# Patient Record
Sex: Male | Born: 1945 | Race: White | Hispanic: No | Marital: Married | Smoking: Former smoker
Health system: Southern US, Community
[De-identification: ages and names within clinical notes are randomized; demographics above are authoritative.]

## PROBLEM LIST (undated history)

## (undated) DIAGNOSIS — I10 Essential (primary) hypertension: Secondary | ICD-10-CM

## (undated) DIAGNOSIS — E785 Hyperlipidemia, unspecified: Secondary | ICD-10-CM

## (undated) DIAGNOSIS — I350 Nonrheumatic aortic (valve) stenosis: Secondary | ICD-10-CM

## (undated) HISTORY — PX: TONSILLECTOMY: SUR1361

## (undated) HISTORY — PX: HERNIA REPAIR: SHX51

## (undated) HISTORY — DX: Nonrheumatic aortic (valve) stenosis: I35.0

## (undated) HISTORY — PX: APPENDECTOMY: SHX54

---

## 1998-01-28 ENCOUNTER — Emergency Department (HOSPITAL_COMMUNITY): Admission: EM | Admit: 1998-01-28 | Discharge: 1998-01-28 | Payer: Self-pay | Admitting: Emergency Medicine

## 1998-01-28 ENCOUNTER — Encounter: Payer: Self-pay | Admitting: Emergency Medicine

## 2007-02-15 ENCOUNTER — Encounter: Admission: RE | Admit: 2007-02-15 | Discharge: 2007-02-15 | Payer: Self-pay | Admitting: Family Medicine

## 2007-02-19 ENCOUNTER — Encounter: Admission: RE | Admit: 2007-02-19 | Discharge: 2007-02-19 | Payer: Self-pay | Admitting: Family Medicine

## 2009-08-13 ENCOUNTER — Ambulatory Visit (HOSPITAL_BASED_OUTPATIENT_CLINIC_OR_DEPARTMENT_OTHER): Admission: RE | Admit: 2009-08-13 | Discharge: 2009-08-13 | Payer: Self-pay | Admitting: General Surgery

## 2010-04-21 LAB — POCT I-STAT, CHEM 8
BUN: 24 mg/dL — ABNORMAL HIGH (ref 6–23)
Calcium, Ion: 1.1 mmol/L — ABNORMAL LOW (ref 1.12–1.32)
Chloride: 107 mEq/L (ref 96–112)
Creatinine, Ser: 1 mg/dL (ref 0.4–1.5)
Glucose, Bld: 106 mg/dL — ABNORMAL HIGH (ref 70–99)
HCT: 45 % (ref 39.0–52.0)
Hemoglobin: 15.3 g/dL (ref 13.0–17.0)
Potassium: 3.8 mEq/L (ref 3.5–5.1)
Sodium: 141 mEq/L (ref 135–145)
TCO2: 25 mmol/L (ref 0–100)

## 2010-04-21 LAB — CBC
HCT: 44.1 % (ref 39.0–52.0)
Hemoglobin: 15.1 g/dL (ref 13.0–17.0)
MCH: 32.5 pg (ref 26.0–34.0)
MCHC: 34.2 g/dL (ref 30.0–36.0)
MCV: 95.1 fL (ref 78.0–100.0)
Platelets: 159 10*3/uL (ref 150–400)
RBC: 4.63 MIL/uL (ref 4.22–5.81)
RDW: 13.3 % (ref 11.5–15.5)
WBC: 10.5 10*3/uL (ref 4.0–10.5)

## 2010-04-21 LAB — DIFFERENTIAL
Basophils Absolute: 0 10*3/uL (ref 0.0–0.1)
Basophils Relative: 1 % (ref 0–1)
Eosinophils Absolute: 0.2 10*3/uL (ref 0.0–0.7)
Eosinophils Relative: 2 % (ref 0–5)
Lymphocytes Relative: 16 % (ref 12–46)
Lymphs Abs: 1.7 10*3/uL (ref 0.7–4.0)
Monocytes Absolute: 0.5 10*3/uL (ref 0.1–1.0)
Monocytes Relative: 5 % (ref 3–12)
Neutro Abs: 8.1 10*3/uL — ABNORMAL HIGH (ref 1.7–7.7)
Neutrophils Relative %: 77 % (ref 43–77)

## 2011-03-11 DIAGNOSIS — Z23 Encounter for immunization: Secondary | ICD-10-CM | POA: Diagnosis not present

## 2011-04-30 DIAGNOSIS — H524 Presbyopia: Secondary | ICD-10-CM | POA: Diagnosis not present

## 2011-06-17 DIAGNOSIS — E782 Mixed hyperlipidemia: Secondary | ICD-10-CM | POA: Diagnosis not present

## 2011-06-17 DIAGNOSIS — I10 Essential (primary) hypertension: Secondary | ICD-10-CM | POA: Diagnosis not present

## 2011-11-20 DIAGNOSIS — Z23 Encounter for immunization: Secondary | ICD-10-CM | POA: Diagnosis not present

## 2011-12-30 DIAGNOSIS — E782 Mixed hyperlipidemia: Secondary | ICD-10-CM | POA: Diagnosis not present

## 2011-12-30 DIAGNOSIS — I10 Essential (primary) hypertension: Secondary | ICD-10-CM | POA: Diagnosis not present

## 2011-12-30 DIAGNOSIS — Z125 Encounter for screening for malignant neoplasm of prostate: Secondary | ICD-10-CM | POA: Diagnosis not present

## 2011-12-30 DIAGNOSIS — Z0000 Encounter for general adult medical examination without abnormal findings: Secondary | ICD-10-CM | POA: Diagnosis not present

## 2012-06-07 DIAGNOSIS — I10 Essential (primary) hypertension: Secondary | ICD-10-CM | POA: Diagnosis not present

## 2012-06-07 DIAGNOSIS — E782 Mixed hyperlipidemia: Secondary | ICD-10-CM | POA: Diagnosis not present

## 2012-11-24 DIAGNOSIS — Z23 Encounter for immunization: Secondary | ICD-10-CM | POA: Diagnosis not present

## 2013-01-25 DIAGNOSIS — E782 Mixed hyperlipidemia: Secondary | ICD-10-CM | POA: Diagnosis not present

## 2013-01-25 DIAGNOSIS — Z0000 Encounter for general adult medical examination without abnormal findings: Secondary | ICD-10-CM | POA: Diagnosis not present

## 2013-01-25 DIAGNOSIS — Z125 Encounter for screening for malignant neoplasm of prostate: Secondary | ICD-10-CM | POA: Diagnosis not present

## 2013-01-25 DIAGNOSIS — I10 Essential (primary) hypertension: Secondary | ICD-10-CM | POA: Diagnosis not present

## 2013-01-25 DIAGNOSIS — Z1331 Encounter for screening for depression: Secondary | ICD-10-CM | POA: Diagnosis not present

## 2013-11-03 DIAGNOSIS — H25033 Anterior subcapsular polar age-related cataract, bilateral: Secondary | ICD-10-CM | POA: Diagnosis not present

## 2013-11-22 DIAGNOSIS — Z23 Encounter for immunization: Secondary | ICD-10-CM | POA: Diagnosis not present

## 2014-02-01 DIAGNOSIS — I10 Essential (primary) hypertension: Secondary | ICD-10-CM | POA: Diagnosis not present

## 2014-02-01 DIAGNOSIS — Z23 Encounter for immunization: Secondary | ICD-10-CM | POA: Diagnosis not present

## 2014-02-01 DIAGNOSIS — Z1389 Encounter for screening for other disorder: Secondary | ICD-10-CM | POA: Diagnosis not present

## 2014-02-01 DIAGNOSIS — Z125 Encounter for screening for malignant neoplasm of prostate: Secondary | ICD-10-CM | POA: Diagnosis not present

## 2014-02-01 DIAGNOSIS — Z87891 Personal history of nicotine dependence: Secondary | ICD-10-CM | POA: Diagnosis not present

## 2014-02-01 DIAGNOSIS — G250 Essential tremor: Secondary | ICD-10-CM | POA: Diagnosis not present

## 2014-02-01 DIAGNOSIS — Z0001 Encounter for general adult medical examination with abnormal findings: Secondary | ICD-10-CM | POA: Diagnosis not present

## 2014-02-01 DIAGNOSIS — E782 Mixed hyperlipidemia: Secondary | ICD-10-CM | POA: Diagnosis not present

## 2014-02-01 DIAGNOSIS — Z86010 Personal history of colonic polyps: Secondary | ICD-10-CM | POA: Diagnosis not present

## 2014-11-14 DIAGNOSIS — Z23 Encounter for immunization: Secondary | ICD-10-CM | POA: Diagnosis not present

## 2015-02-09 DIAGNOSIS — H25013 Cortical age-related cataract, bilateral: Secondary | ICD-10-CM | POA: Diagnosis not present

## 2015-02-27 DIAGNOSIS — I10 Essential (primary) hypertension: Secondary | ICD-10-CM | POA: Diagnosis not present

## 2015-02-27 DIAGNOSIS — Z125 Encounter for screening for malignant neoplasm of prostate: Secondary | ICD-10-CM | POA: Diagnosis not present

## 2015-03-05 DIAGNOSIS — Z0001 Encounter for general adult medical examination with abnormal findings: Secondary | ICD-10-CM | POA: Diagnosis not present

## 2015-03-05 DIAGNOSIS — Z86010 Personal history of colonic polyps: Secondary | ICD-10-CM | POA: Diagnosis not present

## 2015-03-05 DIAGNOSIS — R17 Unspecified jaundice: Secondary | ICD-10-CM | POA: Diagnosis not present

## 2015-03-05 DIAGNOSIS — Z87891 Personal history of nicotine dependence: Secondary | ICD-10-CM | POA: Diagnosis not present

## 2015-03-05 DIAGNOSIS — I10 Essential (primary) hypertension: Secondary | ICD-10-CM | POA: Diagnosis not present

## 2015-03-05 DIAGNOSIS — Z125 Encounter for screening for malignant neoplasm of prostate: Secondary | ICD-10-CM | POA: Diagnosis not present

## 2015-03-05 DIAGNOSIS — G250 Essential tremor: Secondary | ICD-10-CM | POA: Diagnosis not present

## 2015-03-05 DIAGNOSIS — E782 Mixed hyperlipidemia: Secondary | ICD-10-CM | POA: Diagnosis not present

## 2015-03-06 ENCOUNTER — Other Ambulatory Visit: Payer: Self-pay | Admitting: Family Medicine

## 2015-03-06 DIAGNOSIS — R17 Unspecified jaundice: Secondary | ICD-10-CM

## 2015-03-09 ENCOUNTER — Other Ambulatory Visit: Payer: Self-pay

## 2015-03-12 DIAGNOSIS — R749 Abnormal serum enzyme level, unspecified: Secondary | ICD-10-CM | POA: Diagnosis not present

## 2015-03-12 DIAGNOSIS — R945 Abnormal results of liver function studies: Secondary | ICD-10-CM | POA: Diagnosis not present

## 2015-03-14 ENCOUNTER — Ambulatory Visit
Admission: RE | Admit: 2015-03-14 | Discharge: 2015-03-14 | Disposition: A | Discharge status: Home / Self Care | Payer: Medicare Other | Source: Ambulatory Visit | Attending: Family Medicine | Admitting: Family Medicine

## 2015-03-14 DIAGNOSIS — R17 Unspecified jaundice: Secondary | ICD-10-CM

## 2015-03-14 DIAGNOSIS — K8020 Calculus of gallbladder without cholecystitis without obstruction: Secondary | ICD-10-CM | POA: Diagnosis not present

## 2015-04-03 DIAGNOSIS — R932 Abnormal findings on diagnostic imaging of liver and biliary tract: Secondary | ICD-10-CM | POA: Diagnosis not present

## 2015-04-06 ENCOUNTER — Other Ambulatory Visit: Payer: Self-pay | Admitting: Gastroenterology

## 2015-04-17 ENCOUNTER — Ambulatory Visit
Admission: RE | Admit: 2015-04-17 | Discharge: 2015-04-17 | Disposition: A | Discharge status: Home / Self Care | Payer: Medicare Other | Source: Ambulatory Visit | Attending: Gastroenterology | Admitting: Gastroenterology

## 2015-04-17 DIAGNOSIS — R935 Abnormal findings on diagnostic imaging of other abdominal regions, including retroperitoneum: Secondary | ICD-10-CM | POA: Diagnosis not present

## 2015-04-17 MED ORDER — GADOBENATE DIMEGLUMINE 529 MG/ML IV SOLN
17.0000 mL | Freq: Once | INTRAVENOUS | Status: AC | PRN
  Administered 2015-04-17: 17 mL via INTRAVENOUS

## 2015-09-10 DIAGNOSIS — E782 Mixed hyperlipidemia: Secondary | ICD-10-CM | POA: Diagnosis not present

## 2015-09-10 DIAGNOSIS — I10 Essential (primary) hypertension: Secondary | ICD-10-CM | POA: Diagnosis not present

## 2015-10-03 DIAGNOSIS — Z86010 Personal history of colonic polyps: Secondary | ICD-10-CM | POA: Diagnosis not present

## 2015-10-03 DIAGNOSIS — K5730 Diverticulosis of large intestine without perforation or abscess without bleeding: Secondary | ICD-10-CM | POA: Diagnosis not present

## 2015-10-29 DIAGNOSIS — M25511 Pain in right shoulder: Secondary | ICD-10-CM | POA: Diagnosis not present

## 2015-11-28 ENCOUNTER — Ambulatory Visit (INDEPENDENT_AMBULATORY_CARE_PROVIDER_SITE_OTHER): Payer: Self-pay | Admitting: Orthopedic Surgery

## 2015-11-28 DIAGNOSIS — Z23 Encounter for immunization: Secondary | ICD-10-CM | POA: Diagnosis not present

## 2015-12-06 ENCOUNTER — Ambulatory Visit (INDEPENDENT_AMBULATORY_CARE_PROVIDER_SITE_OTHER): Payer: Medicare Other | Admitting: Orthopedic Surgery

## 2015-12-06 ENCOUNTER — Encounter (INDEPENDENT_AMBULATORY_CARE_PROVIDER_SITE_OTHER): Payer: Self-pay | Admitting: Orthopedic Surgery

## 2015-12-06 VITALS — BP 130/71 | HR 55 | Ht 70.0 in | Wt 185.0 lb

## 2015-12-06 DIAGNOSIS — G8929 Other chronic pain: Secondary | ICD-10-CM

## 2015-12-06 DIAGNOSIS — M25511 Pain in right shoulder: Secondary | ICD-10-CM | POA: Diagnosis not present

## 2015-12-06 NOTE — Progress Notes (Signed)
   Office Visit Note   Patient: Matthew Simpson           Date of Birth: [birth year]           MRN: [id number] Visit Date: 12/06/2015 Requested by: No referring provider defined for this encounter. PCP:  Mayra Neer, MD  Subjective: Chief Complaint  Patient presents with  . Right Shoulder - Pain, Follow-up  Abhik is a 70 year old patient 3 months status post lawnmower cord pulling injury to his right shoulder.  Condition diclofenac taper.  Looked at with ultrasound last visit and he might have had a partial-thickness cuff tear but it was hard to assess with certainty.  States his range of motion is okay.  He was doing some hiking and he developed a sharp pain.  He can't do a pushup still.  Patient returns for recheck of right shoulder pain.  Injury was approximately 3 months ago, he pulled on the lawn mower cord too hard.  He is still taking the diclofenac.  He is having pain still, but less. He has a Art gallery manager, and she has helped him with some exercises.  He is trying to be conscious about what motions he makes with the right arm.                  Review of Systems all systems reviewed negative as they relate to the chief complaint the fevers or chills   Assessment & Plan: Visit Diagnoses: No diagnosis found.  Plan: Impression is persistent right shoulder pain with the mechanism very classic for rotator cuff tear.  Exam knotless convincing but he still is symptomatic.  In 3 months since injury and is taken 8 inflammatories and tried yoga course instructor exercises with the some but not complete relief.  Plan MRI arthrogram right shoulder to evaluate for cuff tear versus labral pathology versus biceps tendon pathology I don't think it can be nonoperative problem but knowing more about the structure of his cuff could help [place] to guide him in terms of activity restrictions.  Follow-Up Instructions: No Follow-up on file.   Orders:  No orders of the defined types were placed in  this encounter.  No orders of the defined types were placed in this encounter.     Procedures: No procedures performed   Clinical Data: No additional findings.  Objective: Vital Signs: There were no vitals taken for this visit.  Physical Exam  Ortho Exam  Specialty Comments:  No specialty comments available.  Imaging: No results found.   PMFS History: There are no active problems to display for this patient.  No past medical history on file.  No family history on file.  No past surgical history on file. Social History   Occupational History  . Not on file.   Social History Main Topics  . Smoking status: Former Smoker    Types: Cigarettes    Quit date: 2006  . Smokeless tobacco: Never Used  . Alcohol use 0.6 oz/week    1 Cans of beer per week  . Drug use: No  . Sexual activity: Not on file

## 2015-12-20 ENCOUNTER — Ambulatory Visit (INDEPENDENT_AMBULATORY_CARE_PROVIDER_SITE_OTHER): Payer: Medicare Other | Admitting: Orthopedic Surgery

## 2015-12-31 ENCOUNTER — Ambulatory Visit
Admission: RE | Admit: 2015-12-31 | Discharge: 2015-12-31 | Disposition: A | Discharge status: Home / Self Care | Payer: Medicare Other | Source: Ambulatory Visit | Attending: Orthopedic Surgery | Admitting: Orthopedic Surgery

## 2015-12-31 DIAGNOSIS — G8929 Other chronic pain: Secondary | ICD-10-CM

## 2015-12-31 DIAGNOSIS — M25511 Pain in right shoulder: Principal | ICD-10-CM

## 2015-12-31 DIAGNOSIS — S46011A Strain of muscle(s) and tendon(s) of the rotator cuff of right shoulder, initial encounter: Secondary | ICD-10-CM | POA: Diagnosis not present

## 2015-12-31 MED ORDER — IOPAMIDOL (ISOVUE-M 200) INJECTION 41%
15.0000 mL | Freq: Once | INTRAMUSCULAR | Status: AC
  Administered 2015-12-31: 15 mL via INTRA_ARTICULAR

## 2016-01-03 ENCOUNTER — Ambulatory Visit (INDEPENDENT_AMBULATORY_CARE_PROVIDER_SITE_OTHER): Payer: Medicare Other | Admitting: Orthopedic Surgery

## 2016-01-03 ENCOUNTER — Encounter (INDEPENDENT_AMBULATORY_CARE_PROVIDER_SITE_OTHER): Payer: Self-pay | Admitting: Orthopedic Surgery

## 2016-01-03 DIAGNOSIS — M75121 Complete rotator cuff tear or rupture of right shoulder, not specified as traumatic: Secondary | ICD-10-CM | POA: Diagnosis not present

## 2016-01-03 NOTE — Progress Notes (Signed)
Office Visit Note   Patient: Matthew Simpson           Date of Birth: May 23, 1945           MRN: [id number] Visit Date: 01/03/2016 Requested by: No referring provider defined for this encounter. PCP: Mayra Neer, MD  Subjective: Chief Complaint  Patient presents with  . Right Shoulder - Pain, Follow-up    HPI Matthew Simpson is a 70 year old patient with right shoulder pain.  Since I've seen and had an MRI scan.  Injured his shoulder 4 months ago.  MRI scan shows rotator cuff tear supraspinatus 16 x 19 mm with about a centimeter retraction.  He is not taking any pain medication for this.  He is very active doing workouts and yoga.              Review of Systems All systems reviewed are negative as they relate to the chief complaint within the history of present illness.  Patient denies  fevers or chills.    Assessment & Plan: Visit Diagnoses:  1. Complete tear of right rotator cuff     Plan: Impression is right shoulder rotator cuff tear in an active 70 year old patient.  He wants to ride this out a little bit before considering surgery.  Wife has to have revision cataract surgery tomorrow which involves the retina.  I did tell him the natural history of this was that it would not repair itself based on its size but it may not come significantly more symptomatic for him for a period of several years.  He is having a baseline amount of pain in this currently but has not had developed a frozen shoulder.  I think best option for him at this point would be clinical recheck in 6 months.  Anticipate that the tear should be fixable still at that time if his symptoms worsen.  Follow-Up Instructions: Return in about 6 months (around 07/02/2016).   Orders:  No orders of the defined types were placed in this encounter.  No orders of the defined types were placed in this encounter.     Procedures: No procedures performed   Clinical Data: No additional findings.  Objective: Vital  Signs: There were no vitals taken for this visit.  Physical Exam  Constitutional: He appears well-developed.  HENT:  Head: Normocephalic.  Eyes: EOM are normal.  Neck: Normal range of motion.  Cardiovascular: Normal rate.   Pulmonary/Chest: Effort normal.  Neurological: He is alert.  Skin: Skin is warm.  Psychiatric: He has a normal mood and affect.    Ortho Exam exam of the right shoulder demonstrates full active and passive range of motion.  However when he is going from arm at his waist to arm up he does have to maneuver and situated it does represent some rotator cuff weakness.  However I do not feel a lot of coarseness and grinding with internal/external rotation at varying degrees of abduction.  Rotator cuff strength in general to infraspinatus supraspinatus and subscap testing is intact and symmetric but slightly more painful on the right to supraspinatus testing  Specialty Comments:  No specialty comments available.  Imaging: No results found.   PMFS History: There are no active problems to display for this patient.  No past medical history on file.  No family history on file.  No past surgical history on file. Social History   Occupational History  . Not on file.   Social History Main Topics  . Smoking status: Former  Smoker    Types: Cigarettes    Quit date: 2006  . Smokeless tobacco: Never Used  . Alcohol use 0.6 oz/week    1 Cans of beer per week  . Drug use: No  . Sexual activity: Not on file

## 2016-01-19 ENCOUNTER — Encounter (HOSPITAL_COMMUNITY): Payer: Self-pay | Admitting: Emergency Medicine

## 2016-01-19 ENCOUNTER — Emergency Department (HOSPITAL_COMMUNITY)
Admission: EM | Admit: 2016-01-19 | Discharge: 2016-01-19 | Disposition: A | Discharge status: Home / Self Care | Payer: Medicare Other | Attending: Emergency Medicine | Admitting: Emergency Medicine

## 2016-01-19 DIAGNOSIS — W11XXXA Fall on and from ladder, initial encounter: Secondary | ICD-10-CM | POA: Diagnosis not present

## 2016-01-19 DIAGNOSIS — Y939 Activity, unspecified: Secondary | ICD-10-CM | POA: Diagnosis not present

## 2016-01-19 DIAGNOSIS — S81811A Laceration without foreign body, right lower leg, initial encounter: Secondary | ICD-10-CM | POA: Diagnosis not present

## 2016-01-19 DIAGNOSIS — I10 Essential (primary) hypertension: Secondary | ICD-10-CM | POA: Insufficient documentation

## 2016-01-19 DIAGNOSIS — S81819A Laceration without foreign body, unspecified lower leg, initial encounter: Secondary | ICD-10-CM

## 2016-01-19 DIAGNOSIS — Z87891 Personal history of nicotine dependence: Secondary | ICD-10-CM | POA: Diagnosis not present

## 2016-01-19 DIAGNOSIS — Z7982 Long term (current) use of aspirin: Secondary | ICD-10-CM | POA: Insufficient documentation

## 2016-01-19 DIAGNOSIS — Y929 Unspecified place or not applicable: Secondary | ICD-10-CM | POA: Diagnosis not present

## 2016-01-19 DIAGNOSIS — S81812A Laceration without foreign body, left lower leg, initial encounter: Secondary | ICD-10-CM | POA: Diagnosis not present

## 2016-01-19 DIAGNOSIS — Y999 Unspecified external cause status: Secondary | ICD-10-CM | POA: Insufficient documentation

## 2016-01-19 HISTORY — DX: Hyperlipidemia, unspecified: E78.5

## 2016-01-19 HISTORY — DX: Essential (primary) hypertension: I10

## 2016-01-19 MED ORDER — LIDOCAINE-EPINEPHRINE (PF) 2 %-1:200000 IJ SOLN
20.0000 mL | Freq: Once | INTRAMUSCULAR | Status: AC
  Administered 2016-01-19: 20 mL

## 2016-01-19 MED ORDER — LIDOCAINE-EPINEPHRINE (PF) 2 %-1:200000 IJ SOLN
INTRAMUSCULAR | Status: AC
  Filled 2016-01-19: qty 20

## 2016-01-19 MED ORDER — TETANUS-DIPHTH-ACELL PERTUSSIS 5-2.5-18.5 LF-MCG/0.5 IM SUSP
0.5000 mL | Freq: Once | INTRAMUSCULAR | Status: AC
  Administered 2016-01-19: 0.5 mL via INTRAMUSCULAR
  Filled 2016-01-19: qty 0.5

## 2016-01-19 NOTE — ED Triage Notes (Signed)
Pt presents with bilateral lacerations to lower legs related to event with ladder. No fall or LOC.

## 2016-01-19 NOTE — ED Provider Notes (Signed)
[facility] DEPT Provider Note   CSN: [id number] Arrival date & time: 01/19/16  [id number]     History   Chief Complaint Chief Complaint  Patient presents with  . Laceration    HPI Matthew Simpson is a 70 y.o. male. He presents lacerations to the anterior aspect of both lower legs. He was up on a ladder cleaning his first for color. The ladder slid backwards at the bottom. He fell between the rungs. The rung of the ladder and lacerated the anterior aspect of both lower legs. He is ambulatory. Minimal pain. Cough wraps on his legs and drove himself here.  HPI  Past Medical History:  Diagnosis Date  . Hyperlipemia   . Hypertension     There are no active problems to display for this patient.   Past Surgical History:  Procedure Laterality Date  . APPENDECTOMY    . HERNIA REPAIR    . TONSILLECTOMY         Home Medications    Prior to Admission medications   Medication Sig Start Date End Date Taking? Authorizing Provider  aspirin 81 MG tablet Take 81 mg by mouth daily.   Yes Historical Provider, MD  atorvastatin (LIPITOR) 40 MG tablet Take 20 mg by mouth at bedtime. 01/07/16  Yes Historical Provider, MD  lisinopril (PRINIVIL,ZESTRIL) 5 MG tablet Take 5 mg by mouth daily.    Yes Historical Provider, MD  lisinopril-hydrochlorothiazide (PRINZIDE,ZESTORETIC) 20-12.5 MG tablet Take 2 tablets by mouth every morning. 10/15/15  Yes Historical Provider, MD  Multiple Vitamins-Minerals (MULTIVITAMIN ADULT PO) Take 1 tablet by mouth daily.   Yes Historical Provider, MD    Family History No family history on file.  Social History Social History  Substance Use Topics  . Smoking status: Former Smoker    Types: Cigarettes    Quit date: 2006  . Smokeless tobacco: Never Used  . Alcohol use 0.6 oz/week    1 Cans of beer per week     Allergies   Patient has no known allergies.   Review of Systems Review of Systems  Constitutional: Negative for appetite change, chills,  diaphoresis, fatigue and fever.  HENT: Negative for mouth sores, sore throat and trouble swallowing.   Eyes: Negative for visual disturbance.  Respiratory: Negative for cough, chest tightness, shortness of breath and wheezing.   Cardiovascular: Negative for chest pain.  Gastrointestinal: Negative for abdominal distention, abdominal pain, diarrhea, nausea and vomiting.  Endocrine: Negative for polydipsia, polyphagia and polyuria.  Genitourinary: Negative for dysuria, frequency and hematuria.  Musculoskeletal: Negative for gait problem.  Skin: Positive for wound. Negative for color change, pallor and rash.  Neurological: Negative for dizziness, syncope, light-headedness and headaches.  Hematological: Does not bruise/bleed easily.  Psychiatric/Behavioral: Negative for behavioral problems and confusion.     Physical Exam Updated Vital Signs BP 160/70 (BP Location: Left Arm)   Pulse 62   Temp 97.5 F (36.4 C) (Oral)   Resp 18   Ht 5\' 10"  (1.778 m)   Wt 182 lb (82.6 kg)   SpO2 99%   BMI 26.11 kg/m   Physical Exam  Constitutional: He is oriented to person, place, and time. He appears well-developed and well-nourished. No distress.  HENT:  Head: Normocephalic.  Eyes: Conjunctivae are normal. Pupils are equal, round, and reactive to light. No scleral icterus.  Neck: Normal range of motion. Neck supple. No thyromegaly present.  Cardiovascular: Normal rate and regular rhythm.  Exam reveals no gallop and no friction rub.  No murmur heard. Pulmonary/Chest: Effort normal and breath sounds normal. No respiratory distress. He has no wheezes. He has no rales.  Abdominal: Soft. Bowel sounds are normal. He exhibits no distension. There is no tenderness. There is no rebound.  Musculoskeletal: Normal range of motion.  Neurological: He is alert and oriented to person, place, and time.  Skin: Skin is warm and dry. No rash noted.  [place]- lacerations central aspect of the midportion of both  lower legs anteriorly.  Psychiatric: He has a normal mood and affect. His behavior is normal.     ED Treatments / Results  Labs (all labs ordered are listed, but only abnormal results are displayed) Labs Reviewed - No data to display  EKG  EKG Interpretation None       Radiology No results found.  Procedures Procedures (including critical care time)  Medications Ordered in ED Medications  lidocaine-EPINEPHrine (XYLOCAINE W/EPI) 2 %-1:200000 (PF) injection (  Hold 01/19/16 0909)  lidocaine-EPINEPHrine (XYLOCAINE W/EPI) 2 %-1:200000 (PF) injection 20 mL (20 mLs Infiltration Given by Other 01/19/16 [id number])     Initial Impression / Assessment and Plan / ED Course  I have reviewed the triage vital signs and the nursing notes.  Pertinent labs & imaging results that were available during my care of the patient were reviewed by me and considered in my medical decision making (see chart for details).  Clinical Course    LACERATION REPAIR Performed by: Lolita Patella Authorized by: Lolita Patella Consent: Verbal consent obtained. Risks and benefits: risks, benefits and alternatives were discussed Consent given by: patient Patient identity confirmed: provided demographic data Prepped and Draped in normal sterile fashion Wound explored  Laceration Location: Right lower leg  Laceration Length: Head 10cm  No Foreign Bodies seen or palpated  Anesthesia: local infiltration  Local anesthetic: lidocaine 1% c epinephrine  Anesthetic total: 5 ml  Irrigation method: syringe Amount of cleaning: standard  Skin closure: 3-0 nylon   Number of sutures: 9  Technique: Simple and horizontal mattress  Patient tolerance: Patient tolerated the procedure well with no immediate complications.  LACERATION REPAIR Performed by: Lolita Patella Authorized by: Lolita Patella Consent: Verbal consent obtained. Risks and benefits: risks, benefits and alternatives were  discussed Consent given by: patient Patient identity confirmed: provided demographic data Prepped and Draped in normal sterile fashion Wound explored  Laceration Location: Left lower lef  Laceration Length: 10cm  No Foreign Bodies seen or palpated  Anesthesia: local infiltration  Local anesthetic: lidocaine 1 % c epinephrine  Anesthetic total: 5 ml  Irrigation method: syringe Amount of cleaning: standard  Skin closure: 3-0 nylon  Number of sutures: 9  Technique: simple and horizontal mattress  Patient tolerance: Patient tolerated the procedure well with no immediate complications.    Final Clinical Impressions(s) / ED Diagnoses   Final diagnoses:  Laceration of lower extremity, unspecified laterality, initial encounter    New Prescriptions New Prescriptions   No medications on file     Tanna Furry, MD 01/19/16 [phone]

## 2016-01-19 NOTE — ED Notes (Signed)
Dr. Jeneen Rinks sutured the wounds; and then per his instruction I applied petrolatum gauze/abd's and secured all with ACE wrap bilat. Pt. Thanks [place] for our care.

## 2016-01-19 NOTE — Discharge Instructions (Signed)
Suture removal in 10-14 days.  Ace wraps when up or active.

## 2016-02-05 DIAGNOSIS — S81811D Laceration without foreign body, right lower leg, subsequent encounter: Secondary | ICD-10-CM | POA: Diagnosis not present

## 2016-02-05 DIAGNOSIS — S81812D Laceration without foreign body, left lower leg, subsequent encounter: Secondary | ICD-10-CM | POA: Diagnosis not present

## 2016-02-05 DIAGNOSIS — W19XXXD Unspecified fall, subsequent encounter: Secondary | ICD-10-CM | POA: Diagnosis not present

## 2016-02-05 DIAGNOSIS — Z4802 Encounter for removal of sutures: Secondary | ICD-10-CM | POA: Diagnosis not present

## 2016-05-26 DIAGNOSIS — Z1159 Encounter for screening for other viral diseases: Secondary | ICD-10-CM | POA: Diagnosis not present

## 2016-05-26 DIAGNOSIS — E782 Mixed hyperlipidemia: Secondary | ICD-10-CM | POA: Diagnosis not present

## 2016-05-26 DIAGNOSIS — I10 Essential (primary) hypertension: Secondary | ICD-10-CM | POA: Diagnosis not present

## 2016-05-26 DIAGNOSIS — Z125 Encounter for screening for malignant neoplasm of prostate: Secondary | ICD-10-CM | POA: Diagnosis not present

## 2016-05-26 DIAGNOSIS — Z0000 Encounter for general adult medical examination without abnormal findings: Secondary | ICD-10-CM | POA: Diagnosis not present

## 2016-05-26 DIAGNOSIS — G250 Essential tremor: Secondary | ICD-10-CM | POA: Diagnosis not present

## 2016-07-02 ENCOUNTER — Encounter (INDEPENDENT_AMBULATORY_CARE_PROVIDER_SITE_OTHER): Payer: Self-pay | Admitting: Orthopedic Surgery

## 2016-07-02 ENCOUNTER — Ambulatory Visit (INDEPENDENT_AMBULATORY_CARE_PROVIDER_SITE_OTHER): Payer: Medicare Other | Admitting: Orthopedic Surgery

## 2016-07-02 DIAGNOSIS — M75121 Complete rotator cuff tear or rupture of right shoulder, not specified as traumatic: Secondary | ICD-10-CM | POA: Diagnosis not present

## 2016-07-03 NOTE — Progress Notes (Signed)
Office Visit Note   Patient: Matthew Simpson           Date of Birth: 1945/09/14           MRN: [id number] Visit Date: 07/02/2016 Requested by: Mayra Neer, MD [address] [place], [place] [address] PCP: Mayra Neer, MD  Subjective: Chief Complaint  Patient presents with  . Right Shoulder - Follow-up    HPI: Matthew Simpson  a 71 year old patient with right shoulder pain he has a known full-thickness supraspinatus tear 15 x 20 mm with 1 cm of retraction.  Patient states his right shoulder hurts and sometimes but he has decided to live with it.  He has to be a little bit more careful with it.  Not taking any medications.  He does yoga and gym machines.  He is now 9 months out from injury.  Has not taking any medication recently              ROS: All systems reviewed are negative as they relate to the chief complaint within the history of present illness.  Patient denies  fevers or chills.   Assessment & Plan: Visit Diagnoses:  1. Nontraumatic complete tear of rotator cuff, right     Plan: Impression is rotator cuff tear which is becoming less symptomatic.  Patient is adjusting his activities accordingly.  Decision point today was for or against surgery.  He has been able to modify his workouts to fit his pain within his envelope of function.  He is going to live with this for now.  If symptoms change he'll come back and we could consider fixation.  I'll see him back as needed.  Follow-Up Instructions: Return if symptoms worsen or fail to improve.   Orders:  No orders of the defined types were placed in this encounter.  No orders of the defined types were placed in this encounter.     Procedures: No procedures performed   Clinical Data: No additional findings.  Objective: Vital Signs: There were no vitals taken for this visit.  Physical Exam:   Constitutional: Patient appears well-developed HEENT:  Head: Normocephalic Eyes:EOM are normal Neck: Normal  range of motion Cardiovascular: Normal rate Pulmonary/chest: Effort normal Neurologic: Patient is alert Skin: Skin is warm Psychiatric: Patient has normal mood and affect    Ortho Exam: Orthopedic exam demonstrates laxity and passive range of motion the right shoulder with only the slightest amount of weakness to abduction testing on the right compared to the left.  External rotation strength testing pre-symmetric bilaterally with equivocal impingement signs on the right negative on the left.  No acromioclavicular joint tenderness.  Negative apprehension relocation testing.  No other masses lymph adenopathy or skin changes noted in the right shoulder region.  Specialty Comments:  No specialty comments available.  Imaging: No results found.   PMFS History: Patient Active Problem List   Diagnosis Date Noted  . Nontraumatic complete tear of rotator cuff, right 07/02/2016   Past Medical History:  Diagnosis Date  . Hyperlipemia   . Hypertension     No family history on file.  Past Surgical History:  Procedure Laterality Date  . APPENDECTOMY    . HERNIA REPAIR    . TONSILLECTOMY     Social History   Occupational History  . Not on file.   Social History Main Topics  . Smoking status: Former Smoker    Types: Cigarettes    Quit date: 2006  . Smokeless tobacco: Never  Used  . Alcohol use 0.6 oz/week    1 Cans of beer per week  . Drug use: No  . Sexual activity: Not on file

## 2016-10-27 DIAGNOSIS — Z23 Encounter for immunization: Secondary | ICD-10-CM | POA: Diagnosis not present

## 2017-06-04 DIAGNOSIS — E782 Mixed hyperlipidemia: Secondary | ICD-10-CM | POA: Diagnosis not present

## 2017-06-04 DIAGNOSIS — I10 Essential (primary) hypertension: Secondary | ICD-10-CM | POA: Diagnosis not present

## 2017-06-04 DIAGNOSIS — R011 Cardiac murmur, unspecified: Secondary | ICD-10-CM | POA: Diagnosis not present

## 2017-06-04 DIAGNOSIS — Z125 Encounter for screening for malignant neoplasm of prostate: Secondary | ICD-10-CM | POA: Diagnosis not present

## 2017-06-04 DIAGNOSIS — G250 Essential tremor: Secondary | ICD-10-CM | POA: Diagnosis not present

## 2017-06-04 DIAGNOSIS — Z0000 Encounter for general adult medical examination without abnormal findings: Secondary | ICD-10-CM | POA: Diagnosis not present

## 2017-06-04 DIAGNOSIS — Z23 Encounter for immunization: Secondary | ICD-10-CM | POA: Diagnosis not present

## 2017-10-21 DIAGNOSIS — Z23 Encounter for immunization: Secondary | ICD-10-CM | POA: Diagnosis not present

## 2017-12-16 IMAGING — US US ABDOMEN LIMITED
1 series · 13 of 25 positions shown · non-contrast
Comparison: None.

ADDENDUM:
These results were called to the ordering clinician or
representative by the Radiologist Assistant, and communication
documented in the PACS or zVision Dashboard.
CLINICAL DATA: 69-year-old male with abnormally elevated bilirubin.
Initial encounter.

EXAM:
US ABDOMEN LIMITED - RIGHT UPPER QUADRANT

[Series 1: us abdomen limited · 0.32mm/px · 13 of 52 slices shown]
[im 1/52]
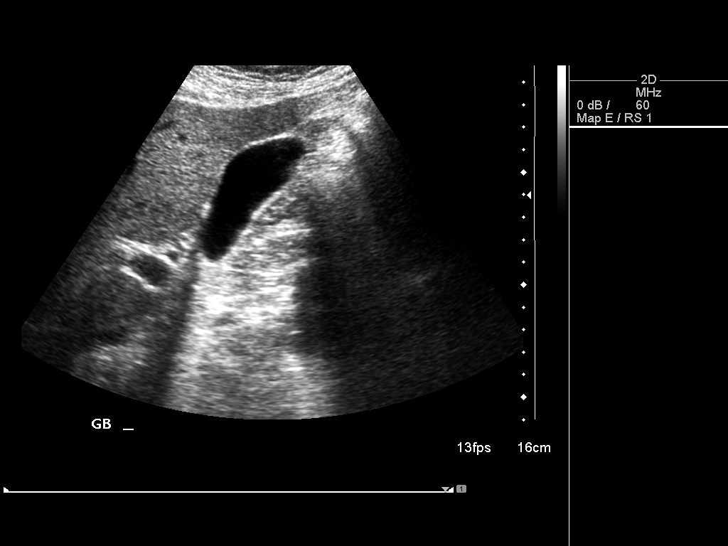
[im 5/52]
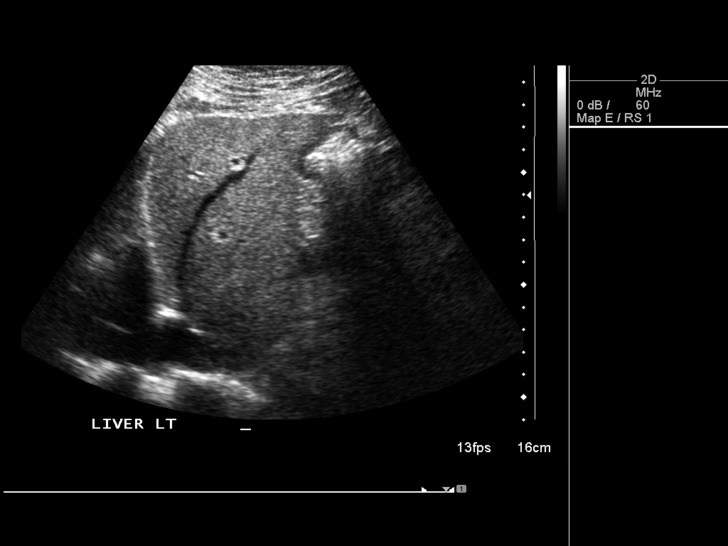
[im 9/52]
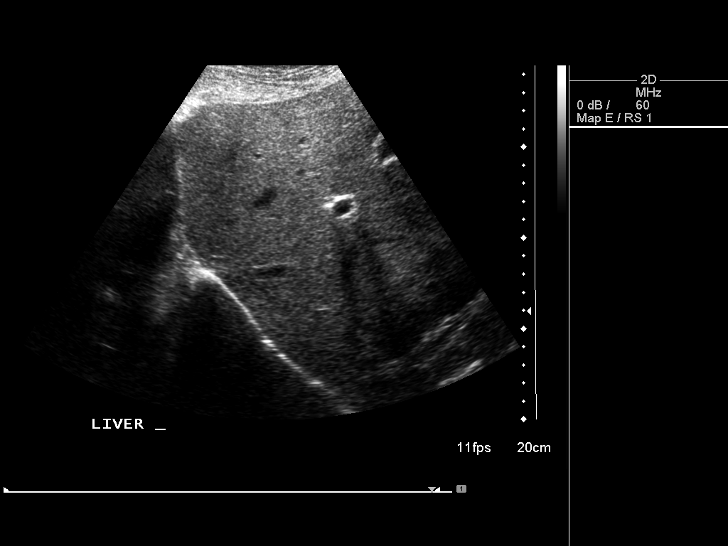
[im 13/52]
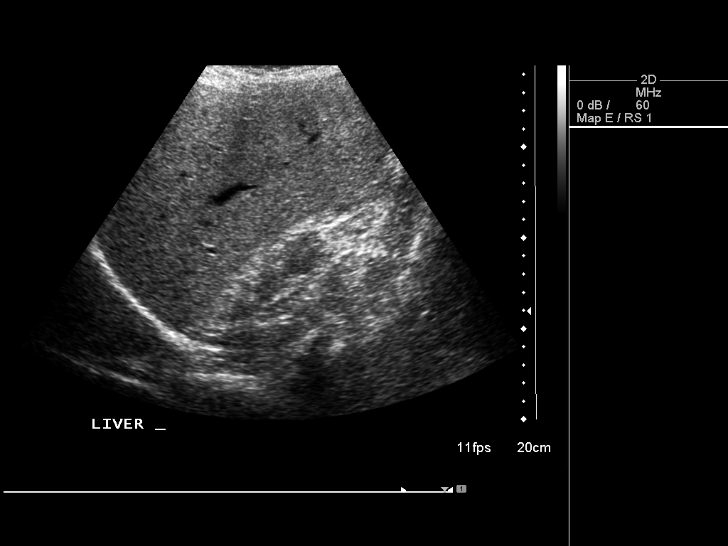
[im 18/52]
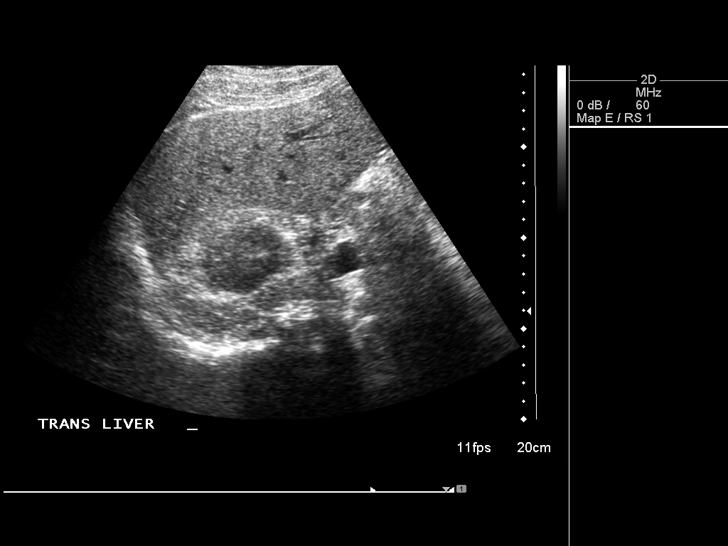
[im 22/52]
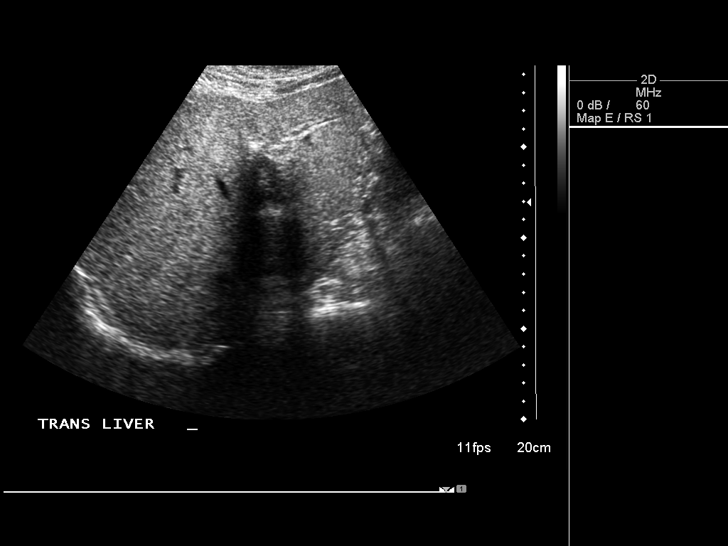
[im 26/52]
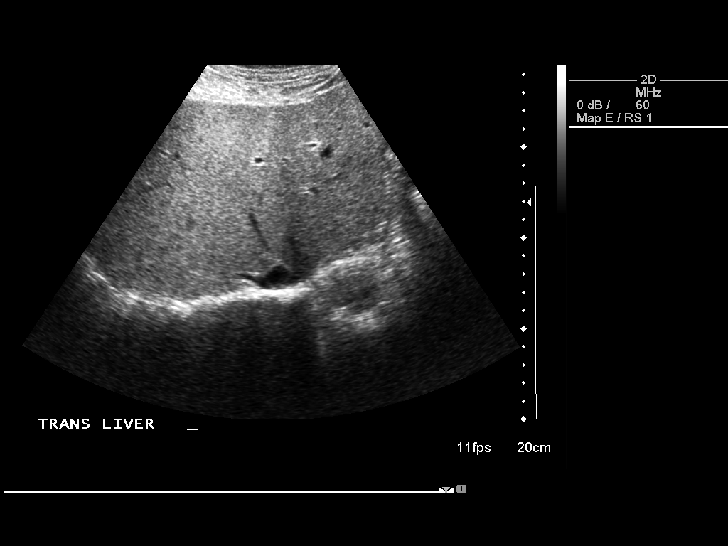
[im 30/52]
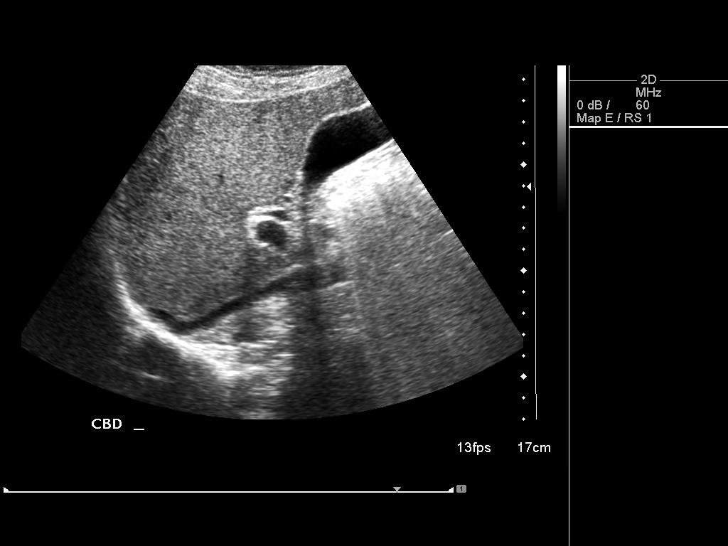
[im 35/52]
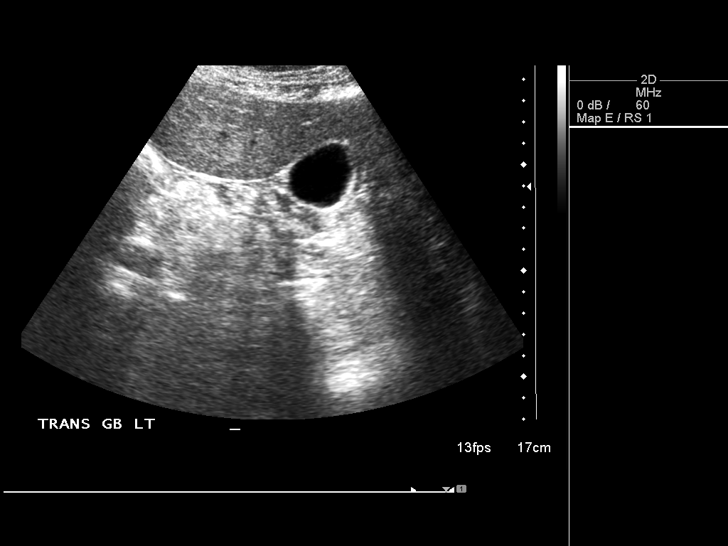
[im 39/52]
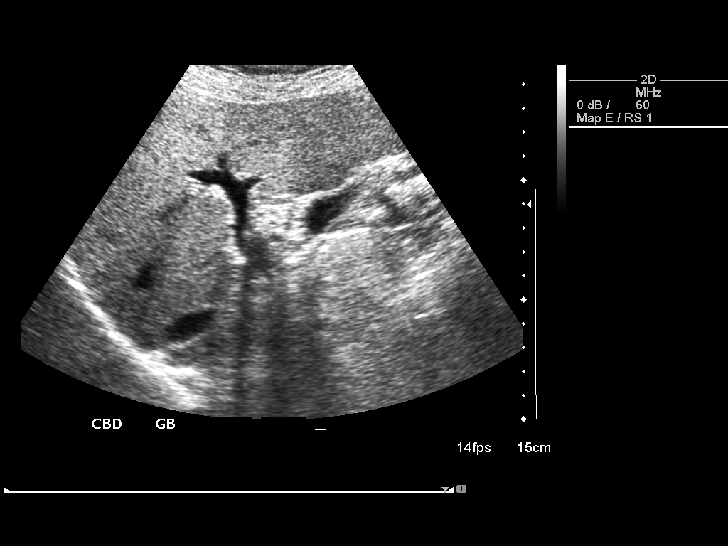
[im 43/52]
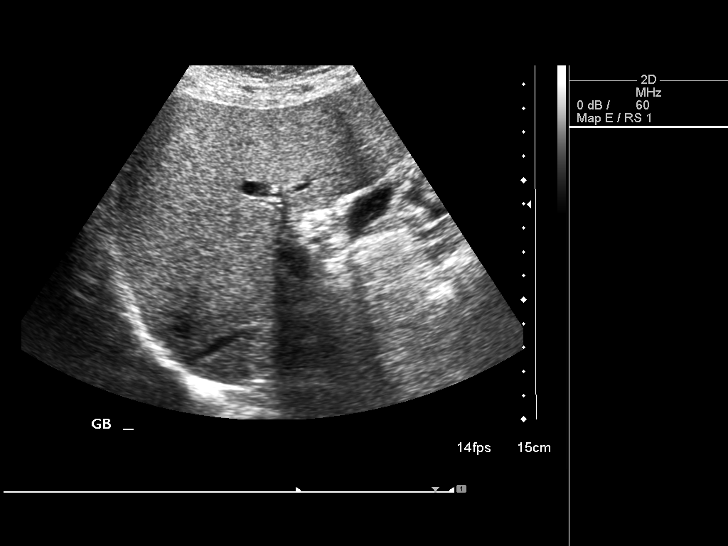
[im 47/52]
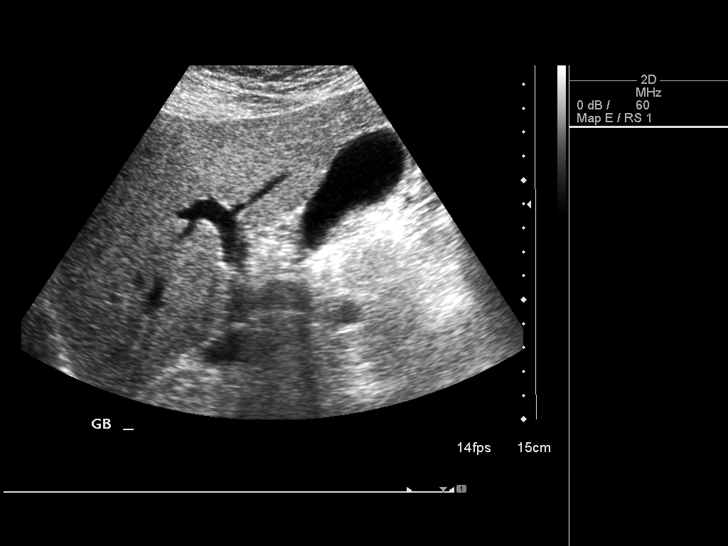
[im 52/52]
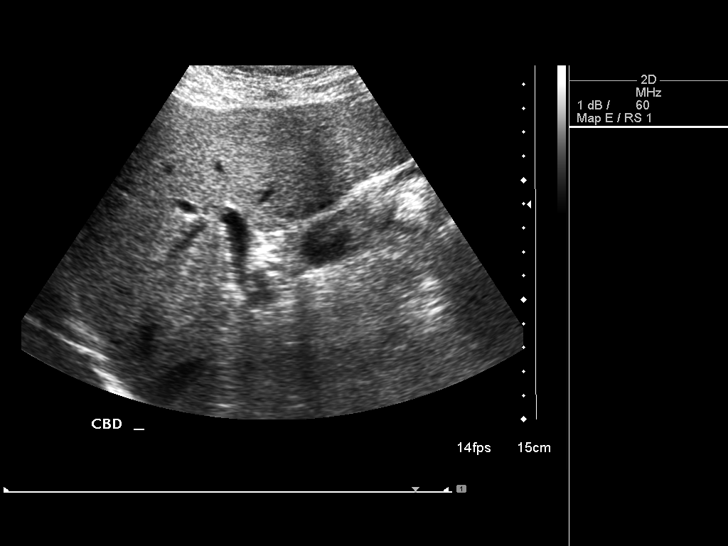

[13 of 25 positions shown; findings below may reference images not displayed]

FINDINGS: Gallbladder:

Normal gallbladder wall thickness of 1 mm. No sonographic Murphy
sign elicited and no pericholecystic fluid. However, there is
moderate suspicion of an 8 mm stone in the neck of the gallbladder
(image 50), near the confluence of the cystic duct.

Common bile duct:

Diameter: 6 mm, upper limits of normal. Moderate suspicion also of a
5 mm calculus here (image 30).

Liver:

Echogenicity at the upper limits of normal. No intrahepatic biliary
ductal dilatation. No discrete liver lesion.

Other findings: Negative visible right kidney with a Bosniak
category 1 lower pole cyst (image 16).
IMPRESSION: 1. Questionable 5-8 mm stones at both the gallbladder neck and CBD
which could explain elevated bilirubin. However, no changes of acute
cholecystitis or biliary ductal dilatation to confirm the
abnormality.
Overall, followup MRCP may best evaluate further.
2. Normal liver otherwise. Right renal simple cyst, inconsequential.

## 2018-07-07 DIAGNOSIS — Z7189 Other specified counseling: Secondary | ICD-10-CM | POA: Diagnosis not present

## 2018-07-07 DIAGNOSIS — G250 Essential tremor: Secondary | ICD-10-CM | POA: Diagnosis not present

## 2018-07-07 DIAGNOSIS — I10 Essential (primary) hypertension: Secondary | ICD-10-CM | POA: Diagnosis not present

## 2018-07-07 DIAGNOSIS — E782 Mixed hyperlipidemia: Secondary | ICD-10-CM | POA: Diagnosis not present

## 2018-07-07 DIAGNOSIS — R011 Cardiac murmur, unspecified: Secondary | ICD-10-CM | POA: Diagnosis not present

## 2018-07-07 DIAGNOSIS — Z125 Encounter for screening for malignant neoplasm of prostate: Secondary | ICD-10-CM | POA: Diagnosis not present

## 2018-07-07 DIAGNOSIS — Z0000 Encounter for general adult medical examination without abnormal findings: Secondary | ICD-10-CM | POA: Diagnosis not present

## 2018-07-12 DIAGNOSIS — Z125 Encounter for screening for malignant neoplasm of prostate: Secondary | ICD-10-CM | POA: Diagnosis not present

## 2018-07-12 DIAGNOSIS — I10 Essential (primary) hypertension: Secondary | ICD-10-CM | POA: Diagnosis not present

## 2018-07-12 DIAGNOSIS — E782 Mixed hyperlipidemia: Secondary | ICD-10-CM | POA: Diagnosis not present

## 2018-10-04 IMAGING — XA DG FLUORO GUIDE NDL PLC/BX
3 series · 3 of 3 positions shown · non-contrast
Comparison: none

CLINICAL DATA: RIGHT shoulder pain. Injured pulling a lawn mower
cord.

[Series 1: ortho standard · 1 of 1 slices shown (1 of 3)]
[im 1/1]
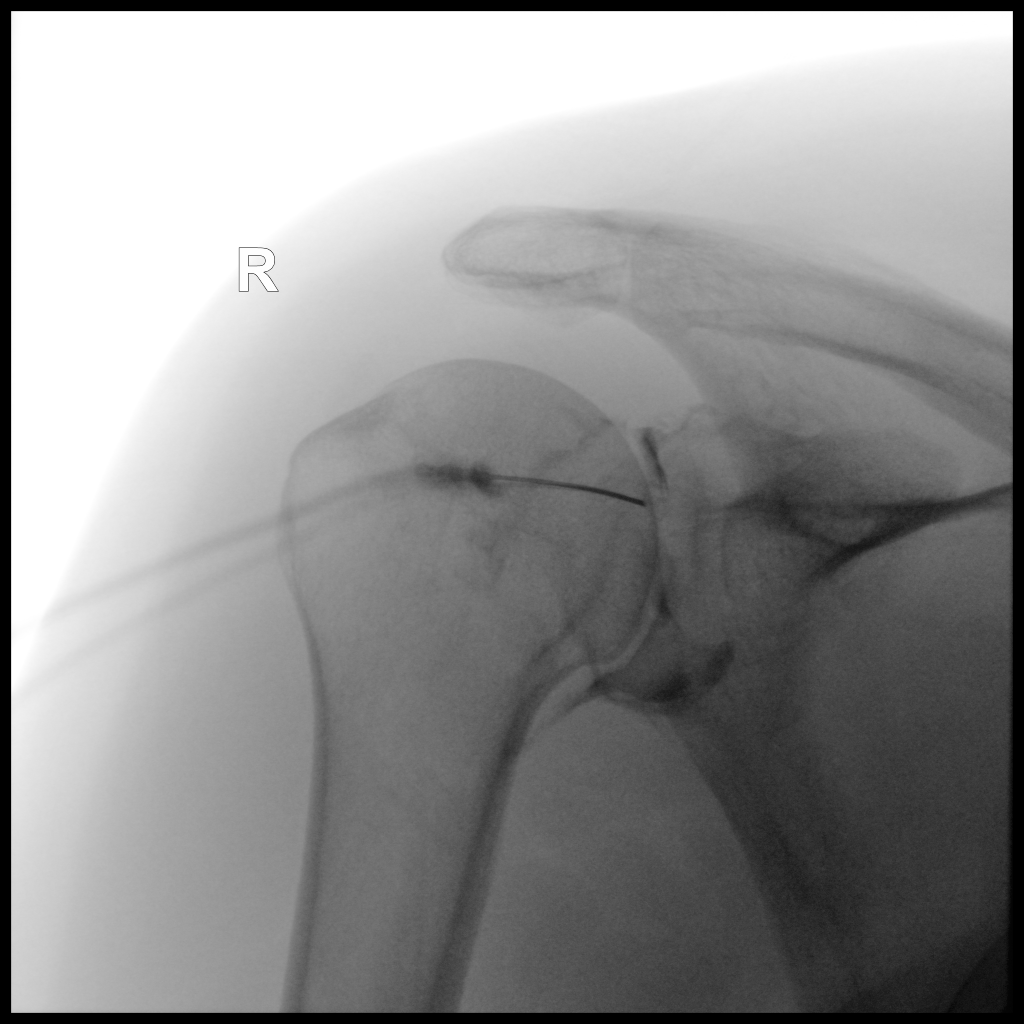

[Series 2: ortho standard · 1 of 1 slices shown (2 of 3)]
[im 1/1]
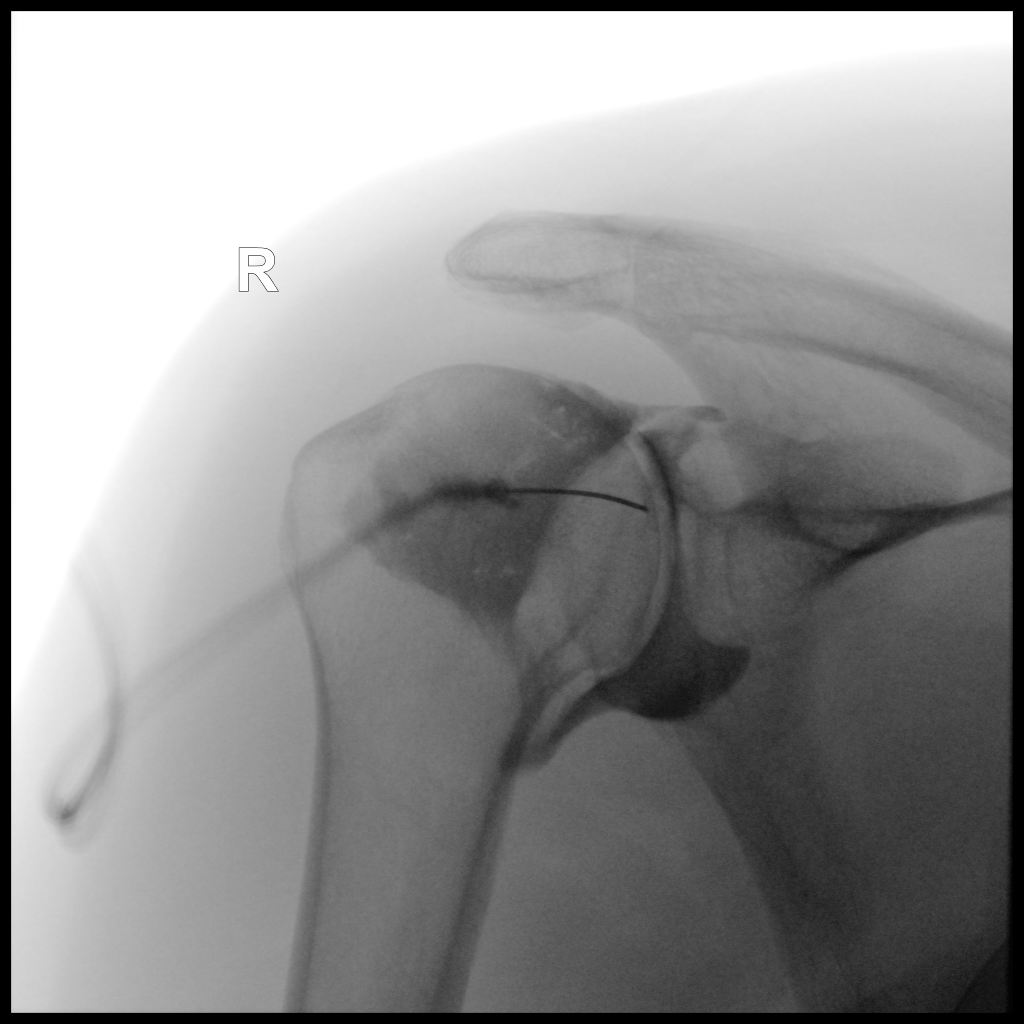

[Series 3: ortho standard · 1 of 1 slices shown (3 of 3)]
[im 1/1]
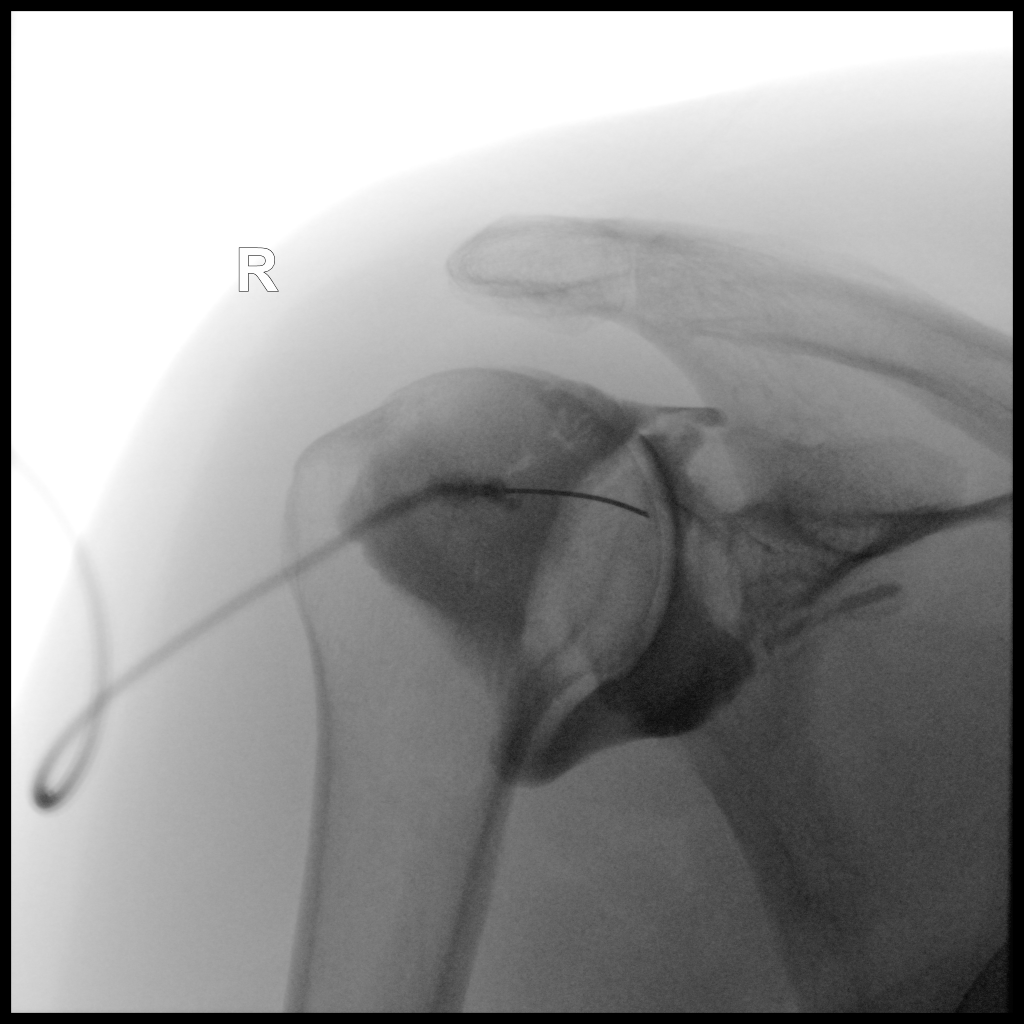

[3 of 3 positions shown; findings below may reference images not displayed]

FLUOROSCOPY TIME:  4 seconds corresponding to a Dose Area Product of
10.68?microGy*m2

PROCEDURE:
RIGHT SHOULDER INJECTION UNDER FLUOROSCOPY

Informed written consent was obtained.  Time-out was performed.

An appropriate skin entrance site was determined. The site was
marked, prepped with Betadine, draped in the usual sterile fashion,
and infiltrated locally with 1% Lidocaine. 22 gauge spinal needle
was advanced to the superomedial margin of the humeral head under
intermittent fluoroscopy. 1 ml of Lidocaine injected easily. A
mixture of 0.1 ml Multihance and 20 ml of dilute Isovue M 200 was
then used to opacify the RIGHT shoulder capsule. No immediate
complication.
IMPRESSION: Technically successful RIGHT shoulder injection for MRI.

## 2018-10-28 DIAGNOSIS — E782 Mixed hyperlipidemia: Secondary | ICD-10-CM | POA: Diagnosis not present

## 2018-11-03 ENCOUNTER — Other Ambulatory Visit: Payer: Self-pay | Admitting: Family Medicine

## 2018-11-03 DIAGNOSIS — R17 Unspecified jaundice: Secondary | ICD-10-CM

## 2018-11-06 DIAGNOSIS — Z23 Encounter for immunization: Secondary | ICD-10-CM | POA: Diagnosis not present

## 2018-11-10 ENCOUNTER — Ambulatory Visit
Admission: RE | Admit: 2018-11-10 | Discharge: 2018-11-10 | Disposition: A | Discharge status: Home / Self Care | Payer: Medicare Other | Source: Ambulatory Visit | Attending: Family Medicine | Admitting: Family Medicine

## 2018-11-10 DIAGNOSIS — R17 Unspecified jaundice: Secondary | ICD-10-CM

## 2018-11-10 DIAGNOSIS — K760 Fatty (change of) liver, not elsewhere classified: Secondary | ICD-10-CM | POA: Diagnosis not present

## 2019-02-22 ENCOUNTER — Ambulatory Visit: Payer: Medicare Other | Attending: Internal Medicine

## 2019-02-22 DIAGNOSIS — Z23 Encounter for immunization: Secondary | ICD-10-CM | POA: Diagnosis not present

## 2019-02-22 NOTE — Progress Notes (Signed)
   Covid-19 Vaccination Clinic  Name:  Matthew Simpson    MRN: [id number] DOB: 09-15-1945  02/22/2019  Matthew Simpson was observed post Covid-19 immunization for 15 minutes without incidence. He was provided with Vaccine Information Sheet and instruction to access the V-Safe system.   Matthew Simpson was instructed to call 911 with any severe reactions post vaccine: Marland Kitchen Difficulty breathing  . Swelling of your face and throat  . A fast heartbeat  . A bad rash all over your body  . Dizziness and weakness    Immunizations Administered    Name Date Dose VIS Date Route   Pfizer COVID-19 Vaccine 02/22/2019 11:22 AM 0.3 mL 01/14/2019 Intramuscular   Manufacturer: [place]   Lot: [id number]   [place]: [id number]

## 2019-03-14 ENCOUNTER — Ambulatory Visit: Payer: Medicare Other | Attending: Internal Medicine

## 2019-03-14 DIAGNOSIS — Z23 Encounter for immunization: Secondary | ICD-10-CM | POA: Insufficient documentation

## 2019-03-14 NOTE — Progress Notes (Signed)
   Covid-19 Vaccination Clinic  Name:  DAMANY KRIKORIAN    MRN: [id number] DOB: [birth year]  03/14/2019  Mr. Bova was observed post Covid-19 immunization for 15 minutes without incidence. He was provided with Vaccine Information Sheet and instruction to access the V-Safe system.   Mr. Savard was instructed to call 911 with any severe reactions post vaccine: Marland Kitchen Difficulty breathing  . Swelling of your face and throat  . A fast heartbeat  . A bad rash all over your body  . Dizziness and weakness    Immunizations Administered    Name Date Dose VIS Date Route   Pfizer COVID-19 Vaccine 03/14/2019 12:18 PM 0.3 mL 01/14/2019 Intramuscular   Manufacturer: [place]   Lot: [id number]   [place]: [id number]

## 2019-03-16 DIAGNOSIS — L980 Pyogenic granuloma: Secondary | ICD-10-CM | POA: Diagnosis not present

## 2019-03-16 DIAGNOSIS — D1801 Hemangioma of skin and subcutaneous tissue: Secondary | ICD-10-CM | POA: Diagnosis not present

## 2019-03-16 DIAGNOSIS — D1809 Hemangioma of other sites: Secondary | ICD-10-CM | POA: Diagnosis not present

## 2019-07-18 ENCOUNTER — Other Ambulatory Visit (HOSPITAL_COMMUNITY): Payer: Self-pay | Admitting: Family Medicine

## 2019-07-18 DIAGNOSIS — Z0000 Encounter for general adult medical examination without abnormal findings: Secondary | ICD-10-CM | POA: Diagnosis not present

## 2019-07-18 DIAGNOSIS — I10 Essential (primary) hypertension: Secondary | ICD-10-CM | POA: Diagnosis not present

## 2019-07-18 DIAGNOSIS — R011 Cardiac murmur, unspecified: Secondary | ICD-10-CM

## 2019-07-18 DIAGNOSIS — Z125 Encounter for screening for malignant neoplasm of prostate: Secondary | ICD-10-CM | POA: Diagnosis not present

## 2019-07-18 DIAGNOSIS — G250 Essential tremor: Secondary | ICD-10-CM | POA: Diagnosis not present

## 2019-07-18 DIAGNOSIS — E782 Mixed hyperlipidemia: Secondary | ICD-10-CM | POA: Diagnosis not present

## 2019-08-17 ENCOUNTER — Other Ambulatory Visit: Payer: Self-pay

## 2019-08-17 ENCOUNTER — Ambulatory Visit (HOSPITAL_COMMUNITY): Payer: Medicare Other | Attending: Cardiology

## 2019-08-17 DIAGNOSIS — R011 Cardiac murmur, unspecified: Secondary | ICD-10-CM | POA: Insufficient documentation

## 2019-10-10 NOTE — Progress Notes (Signed)
Cardiology Office Note:   Date:  10/12/2019  NAME:  Matthew Simpson    MRN: [id number] DOB:  [birth year]   PCP:  Mayra Neer, MD  Cardiologist:  No primary care provider on file.   Referring MD: Mayra Neer, MD   Chief Complaint  Patient presents with  . Aortic Stenosis   History of Present Illness:   Matthew Simpson is a 74 y.o. male with a hx of HTN, HLD who is being seen today for the evaluation of aortic stenosis at the request of Mayra Neer, MD.  He reports he has no symptoms of chest pain, shortness of breath or palpitations.  He reports that his heart valve issue was found through examination by his primary care physician.  His echocardiogram confirmed mild to moderate aortic stenosis.  His examination is consistent with mild aortic stenosis.  He does have a history of high blood pressure and high cholesterol.  This is controlled with medication.  His most recent lipid profile shows a total cholesterol 160, HDL 37, LDL 93, triglycerides 175.  Serum creatinine 1.05.  He is not diabetic.  Has never had a heart attack or stroke.  He is a former smoker and smoked for 30 years.  He quit around 2006.  His blood pressure is 142/64 today.  His EKG does demonstrate a right bundle branch block.  This is not known to him in the past.  No symptoms from this.  He overall is without symptoms today.  Family history significant for congestive heart failure in his father.  Problem List 1. Mild to Moderate AS -V max 2.8 m/s, MG 15 mmHG, AVA 1.39 cm2 2. HTN 3. HLD  Past Medical History: Past Medical History:  Diagnosis Date  . Hyperlipemia   . Hypertension     Past Surgical History: Past Surgical History:  Procedure Laterality Date  . APPENDECTOMY    . HERNIA REPAIR    . TONSILLECTOMY      Current Medications: Current Meds  Medication Sig  . aspirin 81 MG tablet Take 81 mg by mouth daily.  Marland Kitchen atorvastatin (LIPITOR) 40 MG tablet Take 20 mg by mouth at bedtime.  Marland Kitchen  lisinopril-hydrochlorothiazide (PRINZIDE,ZESTORETIC) 20-12.5 MG tablet Take 2 tablets by mouth every morning.  . Multiple Vitamins-Minerals (MULTIVITAMIN ADULT PO) Take 1 tablet by mouth daily.     Allergies:    Patient has no known allergies.   Social History: Social History   Socioeconomic History  . Marital status: Married    Spouse name: Not on file  . Number of children: 2  . Years of education: Not on file  . Highest education level: Not on file  Occupational History  . Occupation: retired Lexicographer  Tobacco Use  . Smoking status: Former Smoker    Years: 30.00    Types: Cigarettes    Quit date: 2006    Years since quitting: 15.6  . Smokeless tobacco: Never Used  Substance and Sexual Activity  . Alcohol use: Yes    Alcohol/week: 1.0 standard drink    Types: 1 Cans of beer per week  . Drug use: No  . Sexual activity: Not on file  Other Topics Concern  . Not on file  Social History Narrative  . Not on file   Social Determinants of Health   Financial Resource Strain:   . Difficulty of Paying Living Expenses: Not on file  Food Insecurity:   . Worried About Charity fundraiser in the Last Year:  Not on file  . Ran Out of Food in the Last Year: Not on file  Transportation Needs:   . Lack of Transportation (Medical): Not on file  . Lack of Transportation (Non-Medical): Not on file  Physical Activity:   . Days of Exercise per Week: Not on file  . Minutes of Exercise per Session: Not on file  Stress:   . Feeling of Stress : Not on file  Social Connections:   . Frequency of Communication with Friends and Family: Not on file  . Frequency of Social Gatherings with Friends and Family: Not on file  . Attends Religious Services: Not on file  . Active Member of Clubs or Organizations: Not on file  . Attends Archivist Meetings: Not on file  . Marital Status: Not on file    Family History: The patient's family history includes Heart failure in his  father.  ROS:   All other ROS reviewed and negative. Pertinent positives noted in the HPI.     EKGs/Labs/Other Studies Reviewed:   The following studies were personally reviewed by me today:  EKG:  EKG is ordered today.  The ekg ordered today demonstrates sinus bradycardia, heart rate 50, right bundle branch block noted, and was personally reviewed by me.   TTE 08/17/2019 1. Left ventricular ejection fraction, by estimation, is 60 to 65%. The  left ventricle has normal function. The left ventricle has no regional  wall motion abnormalities. There is mild left ventricular hypertrophy.  Left ventricular diastolic parameters  are consistent with Grade I diastolic dysfunction (impaired relaxation).  2. Right ventricular systolic function is normal. The right ventricular  size is normal.  3. Left atrial size was mildly dilated.  4. Right atrial size was mildly dilated.  5. The mitral valve is degenerative. No evidence of mitral valve  regurgitation. No evidence of mitral stenosis.  6. The aortic valve is abnormal. Aortic valve regurgitation is trivial.  Mild to moderate aortic valve stenosis. Aortic valve area, by VTI measures  1.39 cm. Aortic valve mean gradient measures 15.0 mmHg. Aortic valve Vmax  measures 2.75 m/s.  7. The inferior vena cava is normal in size with greater than 50%  respiratory variability, suggesting right atrial pressure of 3 mmHg.   Recent Labs: No results found for requested labs within last 8760 hours.   Recent Lipid Panel No results found for: CHOL, TRIG, HDL, CHOLHDL, VLDL, LDLCALC, LDLDIRECT  Physical Exam:   VS:  BP (!) 142/64   Pulse (!) 50   Ht 5\' 10"  (1.778 m)   Wt 179 lb (81.2 kg)   SpO2 98%   BMI 25.68 kg/m    Wt Readings from Last 3 Encounters:  10/12/19 179 lb (81.2 kg)  01/19/16 182 lb (82.6 kg)  12/06/15 185 lb (83.9 kg)    General: Well nourished, well developed, in no acute distress Heart: Atraumatic, normal size  Eyes:  PEERLA, EOMI  Neck: Supple, no JVD Endocrine: No thryomegaly Cardiac: Normal S1, S2; 2 out of 6 systolic ejection murmur, early peaking Lungs: Clear to auscultation bilaterally, no wheezing, rhonchi or rales  Abd: Soft, nontender, no hepatomegaly  Ext: No edema, pulses 2+ Musculoskeletal: No deformities, BUE and BLE strength normal and equal Skin: Warm and dry, no rashes   Neuro: Alert and oriented to person, place, time, and situation, CNII-XII grossly intact, no focal deficits  Psych: Normal mood and affect   ASSESSMENT:   Matthew Simpson is a 74 y.o. male who  presents for the following: 1. Nonrheumatic aortic valve stenosis   2. Essential hypertension   3. RBBB     PLAN:   1. Nonrheumatic aortic valve stenosis 2. Essential hypertension 3. RBBB -Murmur consistent with mild aortic stenosis.  Echocardiogram consistent with mild aortic stenosis.  We will plan to see him on a yearly basis.  He has no symptoms from this.  We will plan to recheck an echocardiogram in 3 to 5 years.  He will let us know if he develops any symptoms of chest pain, shortness of breath or palpitations.  This could indicate that his valve is narrowing at a more rapid pace.  I did give him the prediction of this possibly being an issue in 8 to 10 years.  We will just have to see how things go on his repeat ultrasound.  Clearly we will follow him clinically.  His blood pressure is well controlled today.  His EKG demonstrates right bundle branch block.  He has normal LV function.  No further evaluation needed.  I encouraged him to remain active.  We did discuss given his history of smoking he does need AAA screening.  Apparently he will have this done through a proprietary company.  He will let us know the results when I see him back in 1 year.  Disposition: Return in about 1 year (around 10/11/2020).  Medication Adjustments/Labs and Tests Ordered: Current medicines are reviewed at length with the patient today.  Concerns  regarding medicines are outlined above.  Orders Placed This Encounter  Procedures  . EKG 12-Lead   No orders of the defined types were placed in this encounter.   Patient Instructions  Medication Instructions:  The current medical regimen is effective;  continue present plan and medications.  *If you need a refill on your cardiac medications before your next appointment, please call your pharmacy*   Follow-Up: At [facility], you and your health needs are our priority.  As part of our continuing mission to provide you with exceptional heart care, we have created designated Provider Care Teams.  These Care Teams include your primary Cardiologist (physician) and Advanced Practice Providers (APPs -  Physician Assistants and Nurse Practitioners) who all work together to provide you with the care you need, when you need it.  We recommend signing up for the patient portal called "MyChart".  Sign up information is provided on this After Visit Summary.  MyChart is used to connect with patients for Virtual Visits (Telemedicine).  Patients are able to view lab/test results, encounter notes, upcoming appointments, etc.  Non-urgent messages can be sent to your provider as well.   To learn more about what you can do with MyChart, go to [email or web address].    Your next appointment:   12 month(s)  The format for your next appointment:   In Person  Provider:   Eleonore Chiquito, MD          Signed, Addison Naegeli. Audie Box, [place]  [address] [place], [place] [address] [phone]  10/12/2019 8:42 AM

## 2019-10-11 ENCOUNTER — Ambulatory Visit: Payer: Medicare Other | Attending: Internal Medicine

## 2019-10-11 DIAGNOSIS — Z23 Encounter for immunization: Secondary | ICD-10-CM

## 2019-10-11 NOTE — Progress Notes (Signed)
   Covid-19 Vaccination Clinic  Name:  KNOWLEDGE ESCANDON    MRN: [id number] DOB: [birth year]  10/11/2019  Mr. Sweetman was observed post Covid-19 immunization for 15 minutes without incident. He was provided with Vaccine Information Sheet and instruction to access the V-Safe system.   Mr. Schnick was instructed to call 911 with any severe reactions post vaccine: Marland Kitchen Difficulty breathing  . Swelling of face and throat  . A fast heartbeat  . A bad rash all over body  . Dizziness and weakness

## 2019-10-12 ENCOUNTER — Ambulatory Visit (INDEPENDENT_AMBULATORY_CARE_PROVIDER_SITE_OTHER): Payer: Medicare Other | Admitting: Cardiovascular Disease

## 2019-10-12 ENCOUNTER — Encounter: Payer: Self-pay | Admitting: Cardiovascular Disease

## 2019-10-12 ENCOUNTER — Other Ambulatory Visit: Payer: Self-pay

## 2019-10-12 VITALS — BP 142/64 | HR 50 | Ht 70.0 in | Wt 179.0 lb

## 2019-10-12 DIAGNOSIS — I4510 Unspecified right bundle-branch block: Secondary | ICD-10-CM | POA: Diagnosis not present

## 2019-10-12 DIAGNOSIS — I350 Nonrheumatic aortic (valve) stenosis: Secondary | ICD-10-CM

## 2019-10-12 DIAGNOSIS — I10 Essential (primary) hypertension: Secondary | ICD-10-CM

## 2019-10-12 NOTE — Patient Instructions (Signed)

## 2019-10-29 DIAGNOSIS — Z23 Encounter for immunization: Secondary | ICD-10-CM | POA: Diagnosis not present

## 2020-11-21 ENCOUNTER — Other Ambulatory Visit: Payer: Self-pay

## 2020-11-21 ENCOUNTER — Encounter: Payer: Self-pay | Admitting: Cardiovascular Disease

## 2020-11-21 ENCOUNTER — Ambulatory Visit (INDEPENDENT_AMBULATORY_CARE_PROVIDER_SITE_OTHER): Payer: Medicare Other | Admitting: Cardiovascular Disease

## 2020-11-21 VITALS — BP 130/70 | HR 48 | Ht 70.0 in | Wt 181.6 lb

## 2020-11-21 DIAGNOSIS — I10 Essential (primary) hypertension: Secondary | ICD-10-CM | POA: Diagnosis not present

## 2020-11-21 DIAGNOSIS — I350 Nonrheumatic aortic (valve) stenosis: Secondary | ICD-10-CM | POA: Diagnosis not present

## 2020-11-21 NOTE — Progress Notes (Signed)
Cardiology Office Note:   Date:  11/21/2020  NAME:  Matthew Simpson    MRN: [id number] DOB:  Mar 10, 1945   PCP:  Mayra Neer, MD  Cardiologist:  None  Electrophysiologist:  None   Referring MD: Mayra Neer, MD   Chief Complaint  Patient presents with   Follow-up   History of Present Illness:   Matthew Simpson is a 75 y.o. male with a hx of aortic stenosis, hypertension who presents for follow-up of aortic stenosis.  He presents for follow-up of aortic stenosis.  No symptoms.  He is exercising 6 to 7 days/week.  He can do gym activity as well as walking.  He can hike up to 6 miles without any chest pain or trouble breathing.  Blood pressure 130/70.  Most recent cholesterol levels are acceptable.  He really is doing well.  Denies any major symptoms in office.  We discussed the natural history of aortic stenosis.  Murmur today still suggest he is in the mild to moderate range.  We will have plans to repeat an echocardiogram in 1 year.  His aortic valve disease may never progress.  We will continue to monitor this closely.   Problem List 1. Mild to Moderate AS -V max 2.8 m/s, MG 15 mmHG, AVA 1.39 cm2 2. HTN 3. HLD -Total cholesterol 139, HDL 40, LDL 82, triglycerides 90  Past Medical History: Past Medical History:  Diagnosis Date   Aortic stenosis    Hyperlipemia    Hypertension     Past Surgical History: Past Surgical History:  Procedure Laterality Date   APPENDECTOMY     HERNIA REPAIR     TONSILLECTOMY      Current Medications: Current Meds  Medication Sig   aspirin 81 MG tablet Take 81 mg by mouth daily.   atorvastatin (LIPITOR) 40 MG tablet Take 20 mg by mouth at bedtime.   lisinopril-hydrochlorothiazide (PRINZIDE,ZESTORETIC) 20-12.5 MG tablet Take 2 tablets by mouth every morning.   Multiple Vitamins-Minerals (MULTIVITAMIN ADULT PO) Take 1 tablet by mouth daily.     Allergies:    Patient has no known allergies.   Social History: Social History    Socioeconomic History   Marital status: Married    Spouse name: Not on file   Number of children: 2   Years of education: Not on file   Highest education level: Not on file  Occupational History   Occupation: retired Lexicographer  Tobacco Use   Smoking status: Former    Years: 30.00    Types: Cigarettes    Quit date: 2006    Years since quitting: 16.8   Smokeless tobacco: Never  Substance and Sexual Activity   Alcohol use: Yes    Alcohol/week: 1.0 standard drink    Types: 1 Cans of beer per week   Drug use: No   Sexual activity: Not on file  Other Topics Concern   Not on file  Social History Narrative   Not on file   Social Determinants of Health   Financial Resource Strain: Not on file  Food Insecurity: Not on file  Transportation Needs: Not on file  Physical Activity: Not on file  Stress: Not on file  Social Connections: Not on file     Family History: The patient's family history includes Heart failure in his father.  ROS:   All other ROS reviewed and negative. Pertinent positives noted in the HPI.     EKGs/Labs/Other Studies Reviewed:   The following studies were personally  reviewed by me today:  TTE 08/17/2019  1. Left ventricular ejection fraction, by estimation, is 60 to 65%. The  left ventricle has normal function. The left ventricle has no regional  wall motion abnormalities. There is mild left ventricular hypertrophy.  Left ventricular diastolic parameters  are consistent with Grade I diastolic dysfunction (impaired relaxation).   2. Right ventricular systolic function is normal. The right ventricular  size is normal.   3. Left atrial size was mildly dilated.   4. Right atrial size was mildly dilated.   5. The mitral valve is degenerative. No evidence of mitral valve  regurgitation. No evidence of mitral stenosis.   6. The aortic valve is abnormal. Aortic valve regurgitation is trivial.  Mild to moderate aortic valve stenosis. Aortic  valve area, by VTI measures  1.39 cm. Aortic valve mean gradient measures 15.0 mmHg. Aortic valve Vmax  measures 2.75 m/s.   7. The inferior vena cava is normal in size with greater than 50%  respiratory variability, suggesting right atrial pressure of 3 mmHg.   Recent Labs: No results found for requested labs within last 8760 hours.   Recent Lipid Panel No results found for: CHOL, TRIG, HDL, CHOLHDL, VLDL, LDLCALC, LDLDIRECT  Physical Exam:   VS:  BP 130/70 (BP Location: Left Arm)   Pulse (!) 48   Ht 5\' 10"  (1.778 m)   Wt 181 lb 9.6 oz (82.4 kg)   SpO2 99%   BMI 26.06 kg/m    Wt Readings from Last 3 Encounters:  11/21/20 181 lb 9.6 oz (82.4 kg)  10/12/19 179 lb (81.2 kg)  01/19/16 182 lb (82.6 kg)    General: Well nourished, well developed, in no acute distress Head: Atraumatic, normal size  Eyes: PEERLA, EOMI  Neck: Supple, no JVD Endocrine: No thryomegaly Cardiac: Normal S1, S2; RRR; 2 out of 6 systolic ejection murmur, clear S2, early peaking Lungs: Clear to auscultation bilaterally, no wheezing, rhonchi or rales  Abd: Soft, nontender, no hepatomegaly  Ext: No edema, pulses 2+ Musculoskeletal: No deformities, BUE and BLE strength normal and equal Skin: Warm and dry, no rashes   Neuro: Alert and oriented to person, place, time, and situation, CNII-XII grossly intact, no focal deficits  Psych: Normal mood and affect   ASSESSMENT:   Matthew Simpson is a 75 y.o. male who presents for the following: 1. Nonrheumatic aortic valve stenosis   2. Essential hypertension     PLAN:   1. Nonrheumatic aortic valve stenosis 2. Essential hypertension -Examination today still consistent with mild to moderate aortic stenosis.  Echocardiogram last year did confirm that diagnosis.  He has no limitations.  Nothing on exam suggest his valvular heart disease is progressing.  I would like for him to see me back in 1 year and repeat an echocardiogram next year.  We will get a better idea of  what his clinical course will look like based on the delta value.  He is on aspirin and statin medication.  He smoked for 30 years.  We did discuss calcium scoring.  He is possibly interested however this will really not change my management.  Given his smoking history I suspect he has early precursors of CAD or even PAD.  But he has no symptoms.  I think for now given that he is on aspirin and statin therapy we can forego any further screening.  We are basically treating him anyway for any conditions he may have.  Given his lack of symptoms with  up to 6 miles of hiking I see no need to test further.      Disposition: Return if symptoms worsen or fail to improve.  Medication Adjustments/Labs and Tests Ordered: Current medicines are reviewed at length with the patient today.  Concerns regarding medicines are outlined above.  Orders Placed This Encounter  Procedures   ECHOCARDIOGRAM COMPLETE    No orders of the defined types were placed in this encounter.   Patient Instructions  Medication Instructions:  The current medical regimen is effective;  continue present plan and medications.  *If you need a refill on your cardiac medications before your next appointment, please call your pharmacy*   Testing/Procedures: Echocardiogram (12 month) - Your physician has requested that you have an echocardiogram. Echocardiography is a painless test that uses sound waves to create images of your heart. It provides your doctor with information about the size and shape of your heart and how well your heart's chambers and valves are working. This procedure takes approximately one hour. There are no restrictions for this procedure. This will be performed at our [facility] location - [address].    Follow-Up: At [facility], you and your health needs are our priority.  As part of our continuing mission to provide you with exceptional heart care, we have created designated Provider Care  Teams.  These Care Teams include your primary Cardiologist (physician) and Advanced Practice Providers (APPs -  Physician Assistants and Nurse Practitioners) who all work together to provide you with the care you need, when you need it.  We recommend signing up for the patient portal called "MyChart".  Sign up information is provided on this After Visit Summary.  MyChart is used to connect with patients for Virtual Visits (Telemedicine).  Patients are able to view lab/test results, encounter notes, upcoming appointments, etc.  Non-urgent messages can be sent to your provider as well.   To learn more about what you can do with MyChart, go to [email or web address].    Your next appointment:   12 month(s)  The format for your next appointment:   In Person  Provider:   Eleonore Chiquito, MD    Time Spent with Patient: I have spent a total of 25 minutes with patient reviewing hospital notes, telemetry, EKGs, labs and examining the patient as well as establishing an assessment and plan that was discussed with the patient.  > 50% of time was spent in direct patient care.  Signed, Addison Naegeli. Audie Box, MD, [place]  [address] [place], [place] [address] [phone]  11/21/2020 10:18 AM

## 2020-11-21 NOTE — Patient Instructions (Signed)
Medication Instructions:  The current medical regimen is effective;  continue present plan and medications.  *If you need a refill on your cardiac medications before your next appointment, please call your pharmacy*   Testing/Procedures: Echocardiogram (12 month) - Your physician has requested that you have an echocardiogram. Echocardiography is a painless test that uses sound waves to create images of your heart. It provides your doctor with information about the size and shape of your heart and how well your heart's chambers and valves are working. This procedure takes approximately one hour. There are no restrictions for this procedure. This will be performed at our [facility] location - [address], [address].    Follow-Up: At [facility], you and your health needs are our priority.  As part of our continuing mission to provide you with exceptional heart care, we have created designated Provider Care Teams.  These Care Teams include your primary Cardiologist (physician) and Advanced Practice Providers (APPs -  Physician Assistants and Nurse Practitioners) who all work together to provide you with the care you need, when you need it.  We recommend signing up for the patient portal called "MyChart".  Sign up information is provided on this After Visit Summary.  MyChart is used to connect with patients for Virtual Visits (Telemedicine).  Patients are able to view lab/test results, encounter notes, upcoming appointments, etc.  Non-urgent messages can be sent to your provider as well.   To learn more about what you can do with MyChart, go to [email or web address].    Your next appointment:   12 month(s)  The format for your next appointment:   In Person  Provider:   Eleonore Chiquito, MD

## 2021-10-30 ENCOUNTER — Other Ambulatory Visit (HOSPITAL_COMMUNITY): Payer: 59

## 2021-11-06 ENCOUNTER — Ambulatory Visit (HOSPITAL_COMMUNITY): Payer: Medicare Other | Attending: Cardiovascular Disease

## 2021-11-06 DIAGNOSIS — I350 Nonrheumatic aortic (valve) stenosis: Secondary | ICD-10-CM | POA: Diagnosis present

## 2021-11-07 LAB — ECHOCARDIOGRAM COMPLETE
AR max vel: 0.94 cm2
AV Area VTI: 0.92 cm2
AV Area mean vel: 0.84 cm2
AV Mean grad: 33 mmHg
AV Peak grad: 60.6 mmHg
Ao pk vel: 3.89 m/s
Area-P 1/2: 3.11 cm2
P 1/2 time: 414 msec
S' Lateral: 2.8 cm

## 2021-11-11 ENCOUNTER — Telehealth: Payer: Self-pay | Admitting: Cardiovascular Disease

## 2021-11-11 ENCOUNTER — Other Ambulatory Visit: Payer: Self-pay | Admitting: *Deleted

## 2021-11-11 DIAGNOSIS — I350 Nonrheumatic aortic (valve) stenosis: Secondary | ICD-10-CM

## 2021-11-11 NOTE — Telephone Encounter (Signed)
Follow Up:    Patient is calling about his Echo that he had on 11-06-21. He said he would like for someone to  explain the results to him. He also said his primary doctor said they had not received the results - Please leave a message if hw does not answer, he will call right back.h

## 2021-11-11 NOTE — Telephone Encounter (Signed)
Patient given echo results per Dr. Audie Box. Aortic stenosis is moderate.  He will see me back in 1 year and we will follow-up with a repeat ultrasound.  Patient had no questions at this time.

## 2021-11-11 NOTE — Telephone Encounter (Signed)
LMTCB

## 2021-11-11 NOTE — Telephone Encounter (Signed)
Patient advised that Dr. Audie Box has not made notations on echo yet. Patient asked if I would send the echo result to his PCP and again after Dr. Audie Box makes his notations. Echo faxed to Dr. Raliegh Ip. Shaw at [phone].

## 2021-11-27 ENCOUNTER — Ambulatory Visit: Payer: Medicare Other | Attending: Nurse Practitioner | Admitting: Nurse Practitioner

## 2021-11-27 ENCOUNTER — Encounter: Payer: Self-pay | Admitting: Nurse Practitioner

## 2021-11-27 VITALS — BP 120/70 | HR 52 | Ht 69.0 in | Wt 171.4 lb

## 2021-11-27 DIAGNOSIS — I10 Essential (primary) hypertension: Secondary | ICD-10-CM | POA: Diagnosis not present

## 2021-11-27 DIAGNOSIS — E782 Mixed hyperlipidemia: Secondary | ICD-10-CM

## 2021-11-27 DIAGNOSIS — I350 Nonrheumatic aortic (valve) stenosis: Secondary | ICD-10-CM

## 2021-11-27 NOTE — Progress Notes (Signed)
Office Visit    Patient Name: Matthew Simpson Date of Encounter: 11/27/2021  Primary Care Provider:  Mayra Neer, MD Primary Cardiologist:  Evalina Field, MD  Chief Complaint    76 year old male with a history of aortic valve stenosis, hypertension, and hyperlipidemia who presents for follow-up related to aortic stenosis.  Past Medical History    Past Medical History:  Diagnosis Date   Aortic stenosis    Hyperlipemia    Hypertension    Past Surgical History:  Procedure Laterality Date   APPENDECTOMY     HERNIA REPAIR     TONSILLECTOMY      Allergies  No Known Allergies  History of Present Illness    76 year old male with the above past medical history including aortic valve stenosis, hypertension, and hyperlipidemia.  He was last seen in the office on 11/21/2020 and was stable from a cardiac standpoint.  He was exercising most days of the week and was asymptomatic with his aortic stenosis. Most recent echocardiogram in 11/2021 showed EF 60 to 65%, normal LV function, no RWMA, mild LVH, normal RV systolic function, mild aortic valve rotation, moderate aortic valve stenosis, mean gradient 33 mmHg, increased from prior study.  Repeat echocardiogram was recommended in 1 year.  He presents today for follow-up.  Since his last visit he has been stable from a cardiac standpoint.  He continues to exercise regularly. He goes to the gym 6 to 7 days a week, hikes 5 miles weekly, and practices yoga several days a week.  He denies any chest pain, dyspnea, dizziness, presyncope, syncope.  Overall he reports feeling well and other than having questions about his recent echocardiogram, he denies any additional concerns today.  Home Medications    Current Outpatient Medications  Medication Sig Dispense Refill   aspirin 81 MG tablet Take 81 mg by mouth daily.     atorvastatin (LIPITOR) 40 MG tablet Take 20 mg by mouth at bedtime.     lisinopril-hydrochlorothiazide  (PRINZIDE,ZESTORETIC) 20-12.5 MG tablet Take 2 tablets by mouth every morning.     Multiple Vitamins-Minerals (MULTIVITAMIN ADULT PO) Take 1 tablet by mouth daily.     No current facility-administered medications for this visit.     Review of Systems    He denies chest pain, palpitations, dyspnea, pnd, orthopnea, n, v, dizziness, syncope, edema, weight gain, or early satiety. All other systems reviewed and are otherwise negative except as noted above.   Physical Exam    VS:  BP 120/70   Pulse (!) 52   Ht '5\' 9"'$  (1.753 m)   Wt 171 lb 6.4 oz (77.7 kg)   SpO2 97%   BMI 25.31 kg/m  GEN: Well nourished, well developed, in no acute distress. HEENT: normal. Neck: Supple, no JVD, carotid bruits, or masses. Cardiac: RRR, 3/6 murmur, no rubs, or gallops. No clubbing, cyanosis, edema.  Radials/DP/PT 2+ and equal bilaterally.  Respiratory:  Respirations regular and unlabored, clear to auscultation bilaterally. GI: Soft, nontender, nondistended, BS + x 4. MS: no deformity or atrophy. Skin: warm and dry, no rash. Neuro:  Strength and sensation are intact. Psych: Normal affect.  Accessory Clinical Findings    ECG personally reviewed by me today - sinus bradycardia, 52 bpm, RBBB- no acute changes.   Lab Results  Component Value Date   WBC 10.5 08/13/2009   HGB 15.1 08/13/2009   HCT 44.1 08/13/2009   MCV 95.1 08/13/2009   PLT 159 08/13/2009   Lab Results  Component Value  Date   CREATININE 1.0 08/13/2009   BUN 24 (H) 08/13/2009   NA 141 08/13/2009   K 3.8 08/13/2009   CL 107 08/13/2009   No results found for: "ALT", "AST", "GGT", "ALKPHOS", "BILITOT" No results found for: "CHOL", "HDL", "LDLCALC", "LDLDIRECT", "TRIG", "CHOLHDL"  No results found for: "HGBA1C"  Assessment & Plan    1. Aortic stenosis: Echo in 11/2021 showed EF 60 to 65% , normal LV function, no RWMA, mild LVH, normal RV systolic function, mild aortic valve rotation, moderate aortic valve stenosis, mean gradient 33  mmHg, increased from prior study. Patient is asymptomatic. He has excellent exercise tolerance. He had many questions about his most recent echo compared to prior study, and progression of aortic stenosis.  He wants to know why he is not a candidate for surgery at this time.  We discussed this in detail today.  Continue to monitor symptoms.  Repeat echocardiogram was recommended in 1 year.  2. Hypertension: BP well controlled. Continue current antihypertensive regimen.   3. Hyperlipidemia: LDL was 38 in 07/2021.  Continue aspirin, Lipitor.  4. Disposition: Follow-up in 1 year.      Lenna Sciara, NP 11/27/2021, 4:28 PM

## 2021-11-27 NOTE — Patient Instructions (Signed)
Medication Instructions:  Your physician recommends that you continue on your current medications as directed. Please refer to the Current Medication list given to you today.   *If you need a refill on your cardiac medications before your next appointment, please call your pharmacy*   Lab Work: NONE ordered at this time of appointment   If you have labs (blood work) drawn today and your tests are completely normal, you will receive your results only by: MyChart Message (if you have MyChart) OR A paper copy in the mail If you have any lab test that is abnormal or we need to change your treatment, we will call you to review the results.   Testing/Procedures: NONE ordered at this time of appointment     Follow-Up: At [facility], you and your health needs are our priority.  As part of our continuing mission to provide you with exceptional heart care, we have created designated Provider Care Teams.  These Care Teams include your primary Cardiologist (physician) and Advanced Practice Providers (APPs -  Physician Assistants and Nurse Practitioners) who all work together to provide you with the care you need, when you need it.  We recommend signing up for the patient portal called "MyChart".  Sign up information is provided on this After Visit Summary.  MyChart is used to connect with patients for Virtual Visits (Telemedicine).  Patients are able to view lab/test results, encounter notes, upcoming appointments, etc.  Non-urgent messages can be sent to your provider as well.   To learn more about what you can do with MyChart, go to [email or web address].    Your next appointment:   1 year(s)  The format for your next appointment:   In Person  Provider:   Candelero Arrib[place] T O'Neal, MD     Other Instructions   Important Information About Sugar       

## 2022-10-08 ENCOUNTER — Other Ambulatory Visit (HOSPITAL_COMMUNITY): Payer: Medicare Other

## 2022-11-05 NOTE — Progress Notes (Signed)
Cardiology Clinic Note   Patient Name: [name] Date of Encounter: 11/07/2022  Primary Care Provider:  Lupita Raider, MD Primary Cardiologist:  Reatha Harps, MD  Patient Profile    Matthew Simpson 77 year old male presents to the clinic today for follow-up evaluation of his aortic valve stenosis and hyperlipidemia.  Past Medical History    Past Medical History:  Diagnosis Date   Aortic stenosis    Hyperlipemia    Hypertension    Past Surgical History:  Procedure Laterality Date   APPENDECTOMY     HERNIA REPAIR     TONSILLECTOMY      Allergies  No Known Allergies  History of Present Illness    Matthew Simpson has a PMH of HTN, HLD, and aortic valve stenosis.  He was seen and evaluated by cardiology 10/22.  He was stable from a cardiac standpoint.  He was exercising most days of the week and asymptomatic with his aortic valvular stenosis.  His echocardiogram 10/23 showed an EF of 60-65%, normal LV function, mild LVH, mild aortic valve rotation, moderate aortic valve stenosis with a mean gradient of 33 mmHg.  1 year repeat echocardiogram was ordered.  He was seen in follow-up by Bernadene Person, NP 11/27/2021.  He continues to be stable from a cardiac standpoint.  He remained physically active going to the gym 6 to 7 days/week.  He was hiking 5 miles weekly.  He was doing yoga several days per week as well.  He denied chest pain, dizziness, presyncope and syncope.  He reported feeling well overall.  His echocardiogram was reviewed.  He presents to the clinic today for follow-up evaluation and states he feels well.  He continues to be very physically active going to the gym 6 days/week and using the elliptical for 40 minutes.  He also does yoga and resistance training.  He denies chest pain or shortness of breath.  He also enjoys hiking.  [place] in [place].  We reviewed his echocardiogram which shows stable aortic stenosis and normal ejection fraction.   He expressed understanding.  He is from [place] and went to school at [employer].  Today he denies chest pain, shortness of breath, lower extremity edema, fatigue, palpitations, melena, hematuria, hemoptysis, diaphoresis, weakness, presyncope, syncope, orthopnea, and PND.   Home Medications    Prior to Admission medications   Medication Sig Start Date End Date Taking? Authorizing Provider  aspirin 81 MG tablet Take 81 mg by mouth daily.    [provider]  atorvastatin (LIPITOR) 40 MG tablet Take 20 mg by mouth at bedtime. 01/07/16   [provider]  lisinopril-hydrochlorothiazide (PRINZIDE,ZESTORETIC) 20-12.5 MG tablet Take 2 tablets by mouth every morning. 10/15/15   [provider]  Multiple Vitamins-Minerals (MULTIVITAMIN ADULT PO) Take 1 tablet by mouth daily.    [provider]    Family History    Family History  Problem Relation Age of Onset   Heart failure Father    He indicated that the status of his father is unknown.  Social History    Social History   Socioeconomic History   Marital status: Married    Spouse name: Not on file   Number of children: 2   Years of education: Not on file   Highest education level: Not on file  Occupational History   Occupation: retired Investment banker, corporate  Tobacco Use   Smoking status: Former    Current packs/day: 0.00  Types: Cigarettes    Start date: 25    Quit date: 2006    Years since quitting: 18.7   Smokeless tobacco: Never  Substance and Sexual Activity   Alcohol use: Yes    Alcohol/week: 1.0 standard drink of alcohol    Types: 1 Cans of beer per week   Drug use: No   Sexual activity: Not on file  Other Topics Concern   Not on file  Social History Narrative   Not on file   Social Determinants of Health   Financial Resource Strain: Not on file  Food Insecurity: Not on file  Transportation Needs: Not on file  Physical Activity: Not on file  Stress: Not on file   Social Connections: Unknown (06/18/2021)   Received from [facility]   Social Network    Social Network: Not on file  Intimate Partner Violence: Unknown (05/10/2021)   Received from [facility]   HITS    Physically Hurt: Not on file    Insult or Talk Down To: Not on file    Threaten Physical Harm: Not on file    Scream or Curse: Not on file     Review of Systems    General:  No chills, fever, night sweats or weight changes.  Cardiovascular:  No chest pain, dyspnea on exertion, edema, orthopnea, palpitations, paroxysmal nocturnal dyspnea. Dermatological: No rash, lesions/masses Respiratory: No cough, dyspnea Urologic: No hematuria, dysuria Abdominal:   No nausea, vomiting, diarrhea, bright red blood per rectum, melena, or hematemesis Neurologic:  No visual changes, wkns, changes in mental status. All other systems reviewed and are otherwise negative except as noted above.  Physical Exam    VS:  BP 120/70 (BP Location: Left Arm, Patient Position: Sitting, Cuff Size: Normal)   Pulse 78   Ht 5\' 9"  (1.753 m)   Wt 177 lb (80.3 kg)   BMI 26.14 kg/m  , BMI Body mass index is 26.14 kg/m. GEN: Well nourished, well developed, in no acute distress. HEENT: normal. Neck: Supple, no JVD, carotid bruits, or masses. Cardiac: RRR, systolic murmur heard along right sternal border 3/6, rubs, or gallops. No clubbing, cyanosis, edema.  Radials/DP/PT 2+ and equal bilaterally.  Respiratory:  Respirations regular and unlabored, clear to auscultation bilaterally. GI: Soft, nontender, nondistended, BS + x 4. MS: no deformity or atrophy. Skin: warm and dry, no rash. Neuro:  Strength and sensation are intact. Psych: Normal affect.  Accessory Clinical Findings    Recent Labs: No results found for requested labs within last 365 days.   Recent Lipid Panel No results found for: "CHOL", "TRIG", "HDL", "CHOLHDL", "VLDL", "LDLCALC", "LDLDIRECT"       ECG personally  reviewed by me today- EKG Interpretation Date/Time:  Friday November 07 2022 13:08:40 EDT Ventricular Rate:  78 PR Interval:  148 QRS Duration:  150 QT Interval:  448 QTC Calculation: 510 R Axis:   -3  Text Interpretation: Normal sinus rhythm Right bundle branch block Confirmed by Edd Fabian [phone]) on 11/07/2022 1:11:33 PM    Echocardiogram 11/06/2021  IMPRESSIONS     1. Left ventricular ejection fraction, by estimation, is 60 to 65%. The  left ventricle has normal function. The left ventricle has no regional  wall motion abnormalities. There is mild left ventricular hypertrophy.  Left ventricular diastolic parameters  are indeterminate.   2. Right ventricular systolic function is normal. The right ventricular  size is normal. Tricuspid regurgitation signal is inadequate for assessing  PA pressure.  3. Left atrial size was moderately dilated.   4. The mitral valve is normal in structure. Trivial mitral valve  regurgitation. No evidence of mitral stenosis.   5. The aortic valve is tricuspid. There is moderate calcification of the  aortic valve. There is moderate thickening of the aortic valve. Aortic  valve regurgitation is mild. Moderate aortic valve stenosis. Aortic valve  area, by VTI measures 0.92 cm.  Aortic valve mean gradient measures 33.0 mmHg. Aortic valve Vmax measures  3.89 m/s.   6. The inferior vena cava is normal in size with greater than 50%  respiratory variability, suggesting right atrial pressure of 3 mmHg.   Comparison(s): Changes from prior study are noted. Aortic valve mean  gradient has increase from 15 to 33 mmHg, consistent with moderate AS.   FINDINGS   Left Ventricle: Left ventricular ejection fraction, by estimation, is 60  to 65%. The left ventricle has normal function. The left ventricle has no  regional wall motion abnormalities. The left ventricular internal cavity  size was normal in size. There is   mild left ventricular hypertrophy.  Left ventricular diastolic parameters  are indeterminate.   Right Ventricle: The right ventricular size is normal. No increase in  right ventricular wall thickness. Right ventricular systolic function is  normal. Tricuspid regurgitation signal is inadequate for assessing PA  pressure.   Left Atrium: Left atrial size was moderately dilated.   Right Atrium: Right atrial size was normal in size.   Pericardium: There is no evidence of pericardial effusion.   Mitral Valve: The mitral valve is normal in structure. Mild mitral annular  calcification. Trivial mitral valve regurgitation. No evidence of mitral  valve stenosis.   Tricuspid Valve: The tricuspid valve is normal in structure. Tricuspid  valve regurgitation is trivial. No evidence of tricuspid stenosis.   Aortic Valve: The aortic valve is tricuspid. There is moderate  calcification of the aortic valve. There is moderate thickening of the  aortic valve. Aortic valve regurgitation is mild. Aortic regurgitation PHT  measures 414 msec. Moderate aortic stenosis  is present. Aortic valve mean gradient measures 33.0 mmHg. Aortic valve  peak gradient measures 60.6 mmHg. Aortic valve area, by VTI measures 0.92  cm.   Pulmonic Valve: The pulmonic valve was grossly normal. Pulmonic valve  regurgitation is trivial. No evidence of pulmonic stenosis.   Aorta: The aortic root, ascending aorta and aortic arch are all  structurally normal, with no evidence of dilitation or obstruction.   Venous: The inferior vena cava is normal in size with greater than 50%  respiratory variability, suggesting right atrial pressure of 3 mmHg.   IAS/Shunts: The atrial septum is grossly normal.   Echocardiogram 11/07/2022  IMPRESSIONS     1. Left ventricular ejection fraction, by estimation, is 60 to 65%. Left  ventricular ejection fraction by 3D volume is 62 %. The left ventricle has  normal function. The left ventricle has no regional wall motion   abnormalities. Left ventricular diastolic   parameters are consistent with Grade I diastolic dysfunction (impaired  relaxation).   2. Right ventricular systolic function is normal. The right ventricular  size is normal. Tricuspid regurgitation signal is inadequate for assessing  PA pressure.   3. The mitral valve is normal in structure. Trivial mitral valve  regurgitation. No evidence of mitral stenosis. Moderate mitral annular  calcification.   4. The aortic valve is tricuspid. There is severe calcifcation of the  aortic valve. There is severe thickening of the aortic  valve. Aortic valve  regurgitation is mild. Moderate aortic valve stenosis. Aortic valve mean  gradient measures 28.0 mmHg. Aortic   valve Vmax measures 3.51 m/s. Aortic valve acceleration time measures 77  msec.   5. The inferior vena cava is normal in size with greater than 50%  respiratory variability, suggesting right atrial pressure of 3 mmHg.   Comparison(s): Aortic valve gradients are slightly lower than last year  and may be underestimated. Nevertheless, the aortic jet remains early  peaking, consistent with moderate aortic stenosis.   FINDINGS   Left Ventricle: Left ventricular ejection fraction, by estimation, is 60  to 65%. Left ventricular ejection fraction by 3D volume is 62 %. The left  ventricle has normal function. The left ventricle has no regional wall  motion abnormalities. The left  ventricular internal cavity size was normal in size. There is borderline  concentric left ventricular hypertrophy. Left ventricular diastolic  parameters are consistent with Grade I diastolic dysfunction (impaired  relaxation). Indeterminate filling  pressures.   Right Ventricle: The right ventricular size is normal. No increase in  right ventricular wall thickness. Right ventricular systolic function is  normal. Tricuspid regurgitation signal is inadequate for assessing PA  pressure.   Left Atrium: Left atrial  size was normal in size.   Right Atrium: Right atrial size was normal in size.   Pericardium: There is no evidence of pericardial effusion.   Mitral Valve: The mitral valve is normal in structure. Moderate mitral  annular calcification. Trivial mitral valve regurgitation. No evidence of  mitral valve stenosis.   Tricuspid Valve: The tricuspid valve is normal in structure. Tricuspid  valve regurgitation is not demonstrated. No evidence of tricuspid  stenosis.   Aortic Valve: The aortic valve is tricuspid. There is severe calcifcation  of the aortic valve. There is severe thickening of the aortic valve.  Aortic valve regurgitation is mild. Moderate aortic stenosis is present.  Aortic valve mean gradient measures  28.0 mmHg. Aortic valve peak gradient measures 49.3 mmHg. Aortic valve  area, by VTI measures 1.16 cm.   Pulmonic Valve: The pulmonic valve was normal in structure. Pulmonic valve  regurgitation is not visualized. No evidence of pulmonic stenosis.   Aorta: The aortic root and ascending aorta are structurally normal, with  no evidence of dilitation.   Venous: The inferior vena cava is normal in size with greater than 50%  respiratory variability, suggesting right atrial pressure of 3 mmHg.   IAS/Shunts: No atrial level shunt detected by color flow Doppler.    Assessment & Plan   1.  Aortic valve stenosis-no increased DOE or activity intolerance.  Echocardiogram 11/06/2021 showed normal LV function, intermediate diastolic parameters, moderately dilated left atria moderate calcification of the aortic valve with moderate thickening and mild regurgitation.  Moderate aortic valve stenosis was noted with a mean gradient of 33 mmHg.  Echocardiogram 11/07/2022 showed LVEF of 60-65%, G1 DD, trivial mitral valve regurgitation and no evidence of stenosis, severe calcification of the aortic valve with moderate aortic valve stenosis and a mean gradient of 28 mmHg. Repeat echocardiogram in  12 months  Essential hypertension-BP today 120/70. Continue current medical therapy Heart healthy low-sodium diet Maintain physical activity  Hyperlipidemia-LDL 96 on  07/29/22. High-fiber diet Continue atorvastatin Maintain physical activity  Disposition: Follow-up with Dr. Flora Lipps or me in 12 months.   Thomasene Ripple. Deke Tilghman NP-C     11/07/2022, 1:13 PM [facility] [address] Office [phone] Fax [phone]  I spent 14 minutes examining this patient, reviewing medications, and using patient centered shared decision making involving her cardiac care.  Prior to her visit I spent greater than 20 minutes reviewing her past medical history,  medications, and prior cardiac tests.

## 2022-11-07 ENCOUNTER — Encounter: Payer: Self-pay | Admitting: General Practice

## 2022-11-07 ENCOUNTER — Ambulatory Visit (HOSPITAL_COMMUNITY): Payer: Medicare Other | Attending: General Practice | Admitting: General Practice

## 2022-11-07 ENCOUNTER — Ambulatory Visit (HOSPITAL_BASED_OUTPATIENT_CLINIC_OR_DEPARTMENT_OTHER): Payer: Medicare Other

## 2022-11-07 VITALS — BP 120/70 | HR 78 | Ht 69.0 in | Wt 177.0 lb

## 2022-11-07 DIAGNOSIS — I350 Nonrheumatic aortic (valve) stenosis: Secondary | ICD-10-CM | POA: Insufficient documentation

## 2022-11-07 DIAGNOSIS — E782 Mixed hyperlipidemia: Secondary | ICD-10-CM | POA: Insufficient documentation

## 2022-11-07 DIAGNOSIS — I10 Essential (primary) hypertension: Secondary | ICD-10-CM | POA: Insufficient documentation

## 2022-11-07 LAB — ECHOCARDIOGRAM COMPLETE
AR max vel: 1.04 cm2
AV Area VTI: 1.16 cm2
AV Area mean vel: 1.12 cm2
AV Mean grad: 28 mm[Hg]
AV Peak grad: 49.3 mm[Hg]
Ao pk vel: 3.51 m/s
Area-P 1/2: 2.51 cm2
S' Lateral: 2.4 cm

## 2022-11-07 NOTE — Patient Instructions (Signed)
Medication Instructions:  NO CHANGES *If you need a refill on your cardiac medications before your next appointment, please call your pharmacy*   Lab Work: NO LABS If you have labs (blood work) drawn today and your tests are completely normal, you will receive your results only by: MyChart Message (if you have MyChart) OR A paper copy in the mail If you have any lab test that is abnormal or we need to change your treatment, we will call you to review the results.   Testing/Procedures:[address] - IN 1 YEAR Your physician has requested that you have an echocardiogram. Echocardiography is a painless test that uses sound waves to create images of your heart. It provides your doctor with information about the size and shape of your heart and how well your heart's chambers and valves are working. This procedure takes approximately one hour. There are no restrictions for this procedure. Please do NOT wear cologne, perfume, aftershave, or lotions (deodorant is allowed). Please arrive 15 minutes prior to your appointment time.    Follow-Up: At [facility], you and your health needs are our priority.  As part of our continuing mission to provide you with exceptional heart care, we have created designated Provider Care Teams.  These Care Teams include your primary Cardiologist (physician) and Advanced Practice Providers (APPs -  Physician Assistants and Nurse Practitioners) who all work together to provide you with the care you need, when you need it.    Your next appointment:   1 year(s)  Provider:   Reatha Harps, MD

## 2022-11-13 ENCOUNTER — Other Ambulatory Visit (HOSPITAL_COMMUNITY): Payer: Self-pay | Admitting: *Deleted

## 2022-11-13 DIAGNOSIS — I350 Nonrheumatic aortic (valve) stenosis: Secondary | ICD-10-CM

## 2023-07-30 ENCOUNTER — Other Ambulatory Visit (HOSPITAL_COMMUNITY): Payer: Self-pay | Admitting: Family Medicine

## 2023-07-30 DIAGNOSIS — E782 Mixed hyperlipidemia: Secondary | ICD-10-CM

## 2023-09-07 ENCOUNTER — Ambulatory Visit (HOSPITAL_COMMUNITY)
Admission: RE | Admit: 2023-09-07 | Discharge: 2023-09-07 | Disposition: A | Discharge status: Home / Self Care | Payer: Self-pay | Source: Ambulatory Visit | Attending: Family Medicine | Admitting: Family Medicine

## 2023-09-07 DIAGNOSIS — E782 Mixed hyperlipidemia: Secondary | ICD-10-CM | POA: Insufficient documentation

## 2023-10-16 ENCOUNTER — Other Ambulatory Visit (HOSPITAL_BASED_OUTPATIENT_CLINIC_OR_DEPARTMENT_OTHER): Payer: Self-pay | Admitting: *Deleted

## 2023-10-16 DIAGNOSIS — I350 Nonrheumatic aortic (valve) stenosis: Secondary | ICD-10-CM

## 2023-10-16 NOTE — Progress Notes (Signed)
 Order placed for annual Echo.The previous order has expired

## 2023-11-16 ENCOUNTER — Ambulatory Visit (HOSPITAL_COMMUNITY)
Admission: RE | Admit: 2023-11-16 | Discharge: 2023-11-16 | Disposition: A | Discharge status: Home / Self Care | Source: Ambulatory Visit | Attending: Cardiovascular Disease | Admitting: Cardiovascular Disease

## 2023-11-16 DIAGNOSIS — I350 Nonrheumatic aortic (valve) stenosis: Secondary | ICD-10-CM | POA: Insufficient documentation

## 2023-11-16 LAB — ECHOCARDIOGRAM COMPLETE
AR max vel: 1.21 cm2
AV Area VTI: 1.26 cm2
AV Area mean vel: 1.18 cm2
AV Mean grad: 37.5 mmHg
AV Peak grad: 66.3 mmHg
Ao pk vel: 4.07 m/s
Area-P 1/2: 3.59 cm2
P 1/2 time: 496 ms
S' Lateral: 2.4 cm

## 2023-11-17 ENCOUNTER — Ambulatory Visit: Payer: Self-pay | Admitting: Cardiovascular Disease

## 2024-01-07 NOTE — Progress Notes (Unsigned)
 Cardiology Office Note:  .   Date:  01/08/2024  ID:  Matthew Simpson, DOB March 24, 1945, MRN [id number] PCP: Loreli Kins, MD  [facility] Providers Cardiologist:  Darryle ONEIDA Decent, MD   History of Present Illness: .    Chief Complaint  Patient presents with   Follow-up    Matthew Simpson is a 78 y.o. male with below history who presents for follow-up.   History of Present Illness   Matthew Simpson is a 78 year old male with severe aortic stenosis who presents for follow-up.  No current symptoms such as chest pain, shortness of breath, or dizziness are present. He remains physically active, attending the gym five days a week and engaging in hiking and yoga on the weekends. He feels winded after a few minutes on the elliptical, which he considers expected.  He has hypertension, with his blood pressure noted to be elevated today. He mentions that his blood pressure is usually around 140/60 mmHg and attributes today's reading to 'white coat syndrome,' although he does not feel nervous.  He has hyperlipidemia and is currently taking Lipitor, which was recently increased from 20 mg to 40 mg daily. His cholesterol levels have not been rechecked since June. He has an elevated coronary calcium score, which was noted during his last evaluation.  No history of diabetes, heart attack, or stroke. He is currently on aspirin 81 mg daily.           Problem List 1. Aortic stenosis -Aov calcium score 3021 2. HTN 3. HLD -T chol 145, HDL 38, LDL 76, TG 182 4. CAD -CAC 634 (64th percentile) 5. RBBB    ROS: All other ROS reviewed and negative. Pertinent positives noted in the HPI.     Studies Reviewed: SABRA   EKG Interpretation Date/Time:  Friday January 08 2024 14:31:34 EST Ventricular Rate:  55 PR Interval:  154 QRS Duration:  142 QT Interval:  462 QTC Calculation: 441 R Axis:   35  Text Interpretation: Sinus bradycardia Right bundle branch block Confirmed by Decent Darryle  [phone]) on 01/08/2024 2:44:59 PM   CAC 09/07/2023 IMPRESSION: 1. Coronary calcium score of 634. This was 64th percentile for age-, race-, and sex-matched controls.   2.  Severe aortic valve calcifications (AV calcium score 3021)   TTE 11/16/2023  1. Left ventricular ejection fraction, by estimation, is 60 to 65%. The  left ventricle has normal function. The left ventricle has no regional  wall motion abnormalities. Left ventricular diastolic parameters were  normal. The average left ventricular  global longitudinal strain is -17.9 %. The global longitudinal strain is  normal.   2. Right ventricular systolic function is normal. The right ventricular  size is normal.   3. Left atrial size was moderately dilated.   4. Right atrial size was mildly dilated.   5. The mitral valve is abnormal. Trivial mitral valve regurgitation. No  evidence of mitral stenosis.   6. Comared to TTE done 11/07/22 mean gradient 28-> 37.5 mmHg peak 49.3->  66.3 mmHg DVI decreased from 0.33 to 0.28. AVA greater as LVOT diameter  used in this study 2.4 and in prior was 2.1 . The aortic valve is  tricuspid. There is severe calcifcation of  the aortic valve. There is severe thickening of the aortic valve. Aortic  valve regurgitation is mild. Moderate to severe aortic valve stenosis.   7. The inferior vena cava is normal in size with greater than 50%  respiratory variability, suggesting  right atrial pressure of 3 mmHg.  Physical Exam:   VS:  BP (!) 160/80   Pulse (!) 55   Ht 5' 9 (1.753 m)   Wt 183 lb (83 kg)   SpO2 98%   BMI 27.02 kg/m    Wt Readings from Last 3 Encounters:  01/08/24 183 lb (83 kg)  11/07/22 177 lb (80.3 kg)  11/27/21 171 lb 6.4 oz (77.7 kg)    GEN: Well nourished, well developed in no acute distress NECK: No JVD; No carotid bruits CARDIAC: RRR, 3/6 SEM absent S2, rubs, gallops RESPIRATORY:  Clear to auscultation without rales, wheezing or rhonchi  ABDOMEN: Soft, non-tender,  non-distended EXTREMITIES:  No edema; No deformity  ASSESSMENT AND PLAN: .   Assessment and Plan    Severe nonrheumatic aortic valve stenosis Severe aortic stenosis with significant calcification, asymptomatic. Discussed risks of progression and recommended intervention. TAVR discussed as less invasive option. - Referred to [facility] for evaluation and potential TAVR procedure. - Scheduled appointment with heart valve team in January. - Monitor for symptoms such as dyspnea, chest pain, dizziness, or lightheadedness and report if he occurs.  Essential hypertension Blood pressure elevated, possibly due to white coat syndrome. Usual readings in 140s/60s. Current medications include lisinopril and HCTZ. - Start checking blood pressure daily with a goal of less than 130/80 mmHg. - Continue current antihypertensive regimen.  Mixed hyperlipidemia Coronary calcium score elevated at 634, 64th percentile. On Lipitor 40 mg daily. LDL goal less than 70 mg/dL, recent LDL 76 mg/dL. - Continue Lipitor 40 mg daily.                Follow-up: Return in about 6 months (around 07/08/2024).  Signed, Darryle DASEN. Barbaraann, MD, [facility]  [facility]  [address] [place], [place] [address] [phone]  3:19 PM

## 2024-01-08 ENCOUNTER — Ambulatory Visit: Attending: Cardiovascular Disease | Admitting: Cardiovascular Disease

## 2024-01-08 ENCOUNTER — Encounter: Payer: Self-pay | Admitting: Cardiovascular Disease

## 2024-01-08 ENCOUNTER — Other Ambulatory Visit (HOSPITAL_COMMUNITY): Payer: Self-pay

## 2024-01-08 VITALS — BP 160/80 | HR 55 | Ht 69.0 in | Wt 183.0 lb

## 2024-01-08 DIAGNOSIS — I2510 Atherosclerotic heart disease of native coronary artery without angina pectoris: Secondary | ICD-10-CM

## 2024-01-08 DIAGNOSIS — E782 Mixed hyperlipidemia: Secondary | ICD-10-CM

## 2024-01-08 DIAGNOSIS — I350 Nonrheumatic aortic (valve) stenosis: Secondary | ICD-10-CM

## 2024-01-08 DIAGNOSIS — I10 Essential (primary) hypertension: Secondary | ICD-10-CM

## 2024-01-08 MED ORDER — OMRON 3 SERIES BP MONITOR DEVI
0 refills | Status: AC
  Filled 2024-01-08: qty 1, 30d supply, fill #0

## 2024-01-08 NOTE — Patient Instructions (Addendum)
 Medication Instructions:  Your physician recommends that you continue on your current medications as directed. Please refer to the Current Medication list given to you today.   *If you need a refill on your cardiac medications before your next appointment, please call your pharmacy*  Lab Work: NONE ORDERED  If you have labs (blood work) drawn today and your tests are completely normal, you will receive your results only by: MyChart Message (if you have MyChart) OR A paper copy in the mail If you have any lab test that is abnormal or we need to change your treatment, we will call you to review the results.  Testing/Procedures: NONE ORDERED  Follow-Up: At [facility], you and your health needs are our priority.  As part of our continuing mission to provide you with exceptional heart care, our providers are all part of one team.  This team includes your primary Cardiologist (physician) and Advanced Practice Providers or APPs (Physician Assistants and Nurse Practitioners) who all work together to provide you with the care you need, when you need it.  Your next appointment:    6 Month follow up call in April to schedule June appointment  Provider:   Dr. Barbaraann   Structural Heart Team will be calling with an appointment to discuss TAVR (Transcatheter Aortic Valve Replacement)  We recommend signing up for the patient portal called MyChart.  Sign up information is provided on this After Visit Summary.  MyChart is used to connect with patients for Virtual Visits (Telemedicine).  Patients are able to view lab/test results, encounter notes, upcoming appointments, etc.  Non-urgent messages can be sent to your provider as well.   To learn more about what you can do with MyChart, go to [email or web address].   Other Instructions Check blood pressure daily (blood pressure goal less than 130/80)

## 2024-01-12 ENCOUNTER — Encounter: Payer: Self-pay | Admitting: Cardiovascular Disease

## 2024-01-12 ENCOUNTER — Ambulatory Visit: Attending: Cardiovascular Disease | Admitting: Cardiovascular Disease

## 2024-01-12 VITALS — BP 116/70 | HR 72 | Ht 69.0 in | Wt 178.8 lb

## 2024-01-12 DIAGNOSIS — Z01812 Encounter for preprocedural laboratory examination: Secondary | ICD-10-CM | POA: Diagnosis not present

## 2024-01-12 DIAGNOSIS — I350 Nonrheumatic aortic (valve) stenosis: Secondary | ICD-10-CM | POA: Diagnosis not present

## 2024-01-12 NOTE — Patient Instructions (Signed)
 Medication Instructions:  The current medical regimen is effective;  continue present plan and medications.  *If you need a refill on your cardiac medications before your next appointment, please call your pharmacy*  Lab Work: You will need lab before your heart cath. (BMP, CBC)  You do not need to be fasting.  If you have labs (blood work) drawn today and your tests are completely normal, you will receive your results only by: MyChart Message (if you have MyChart) OR A paper copy in the mail If you have any lab test that is abnormal or we need to change your treatment, we will call you to review the results.  Testing/Procedures:       Cardiac/Peripheral Catheterization   You are scheduled for a Cardiac Catheterization on ,                       with Dr. .  1. Please arrive at the [facility] (Main Entrance A) at [facility]: [address] [place], [place] [address] at        (This time is 2 hour(s) before your procedure to ensure your preparation).   Free valet parking service is available. You will check in at ADMITTING. The support person will be asked to wait in the waiting room.  It is OK to have someone drop you off and come back when you are ready to be discharged.        Special note: Every effort is made to have your procedure done on time. Please understand that emergencies sometimes delay scheduled procedures.  2. Diet: Nothing to eat after midnight.  3. Hydration:You need to be well hydrated before your procedure. On the day of your cath, you may drink approved liquids (see below) until 2 hours before the procedure, with 16 oz of water as your last intake.   List of approved liquids water, clear juice, clear tea, black coffee, fruit juices, non-citric and without pulp, carbonated beverages, Gatorade, Kool -Aid, plain Jello-O and plain ice popsicles.  4. Labs: once heart cath has been scheduled.   You do not need to be fasting.  5. Medication instructions in  preparation for your procedure:  On the morning of your procedure, take Aspirin 81 mg and any morning medicines NOT listed above.  You may use sips of water.  6. Plan to go home the same day, you will only stay overnight if medically necessary. 7. You MUST have a responsible adult to drive you home. 8. An adult MUST be with you the first 24 hours after you arrive home. 9. Bring a current list of your medications, and the last time and date medication taken. 10. Bring ID and current insurance cards. 11.Please wear clothes that are easy to get on and off and wear slip-on shoes.  Thank you for allowing us  to care for you!   -- [facility]  Follow-Up: At [facility], you and your health needs are our priority.  As part of our continuing mission to provide you with exceptional heart care, our providers are all part of one team.  This team includes your primary Cardiologist (physician) and Advanced Practice Providers or APPs (Physician Assistants and Nurse Practitioners) who all work together to provide you with the care you need, when you need it.  Your next appointment:   Follow up to be determined.   We recommend signing up for the patient portal called MyChart.  Sign  up information is provided on this After Visit Summary.  MyChart is used to connect with patients for Virtual Visits (Telemedicine).  Patients are able to view lab/test results, encounter notes, upcoming appointments, etc.  Non-urgent messages can be sent to your provider as well.   To learn more about what you can do with MyChart, go to [email or web address].

## 2024-01-12 NOTE — H&P (View-Only) (Signed)
 " Cardiology Office Note:    Date:  01/13/2024   ID:  Ravinder, Lukehart [birth year], MRN [id number]  PCP:  Loreli Kins, MD   [facility] Providers Cardiologist:  Darryle ONEIDA Decent, MD     Referring MD: Loreli Kins, MD   Chief Complaint  Patient presents with   Aortic Stenosis    History of Present Illness:    Matthew Simpson is a 78 y.o. male referred by Dr Decent for evaluation of aortic stenosis.   The patient is here alone today. He has been physically active and participates in regular exercise. Comorbid conditions include HTN and hyperlipidemia. He had a coronary calcium score in August 2025 showing a coronary calcium score of 634 but an aortic valve calcium score of > 3000.   He reports having a heart murmur during athletic participation physicals in [facility] but never had any limitations placed on him. He was never told of a heart murmur again until Dr Loreli heard it on auscultation a few years ago. He is extremely active. He walks on the elliptical for 40 minutes, covering 3.5 miles. He feels tired when he's finished with exercise but denies any change over the past 5 years. He feels mild shortness of breath near the end of his exercise and recovers within a few minutes. No chest pain or pressure. No lightheadedness or syncope.   He has a torn left rotator cuff, but no other physical limitations. He sees a dentist 3 times/year with no problems reported. He's married, one son in [place], [place], and one daughter in [place], [place].   Current Medications: Current Meds  Medication Sig   aspirin  81 MG tablet Take 81 mg by mouth daily.   atorvastatin (LIPITOR) 40 MG tablet Take 40 mg by mouth daily.   Blood Pressure Monitoring (OMRON 3 SERIES BP MONITOR) DEVI Use as directed to check blood pressure daily.   lisinopril-hydrochlorothiazide (PRINZIDE,ZESTORETIC) 20-12.5 MG tablet Take 2 tablets by mouth every morning.   Multiple Vitamins-Minerals (MULTIVITAMIN ADULT PO)  Take 1 tablet by mouth daily.    Past Medical History:  Diagnosis Date   Aortic stenosis    Hyperlipemia    Hypertension    Past Surgical History:  Procedure Laterality Date   APPENDECTOMY     HERNIA REPAIR     TONSILLECTOMY      Allergies:   Patient has no known allergies.   ROS:   Please see the history of present illness.    All other systems reviewed and are negative.  EKGs/Labs/Other Studies Reviewed:    The following studies were reviewed today: Cardiac Studies & Procedures   ______________________________________________________________________________________________     ECHOCARDIOGRAM  ECHOCARDIOGRAM COMPLETE 11/16/2023  Narrative ECHOCARDIOGRAM REPORT    Patient Name:   Matthew Simpson Date of Exam: 11/16/2023 Medical Rec #:  [id number]      Height:       69.0 in Accession #:    [id number]     Weight:       177.0 lb Date of Birth:  1945-08-08     BSA:          1.961 m Patient Age:    77 years       BP:           120/70 mmHg Patient Gender: M              HR:           63 bpm. Exam Location:  [address]  Procedure: 2D Echo, 3D Echo, Cardiac Doppler, Color Doppler and Strain Analysis (Both Spectral and Color Flow Doppler were utilized during procedure).  Indications:    I35.0 Aortic Stenosis  History:        Patient has prior history of Echocardiogram examinations, most recent 11/07/2022. Aortic Valve Disease; Risk Factors:Former Smoker, Hypertension, Dyslipidemia and Family History of Coronary Artery Disease. Aortic Stenosis.  Sonographer:    Heather Hawks RDCS Referring Phys: JOEN GENTRY  IMPRESSIONS   1. Left ventricular ejection fraction, by estimation, is 60 to 65%. The left ventricle has normal function. The left ventricle has no regional wall motion abnormalities. Left ventricular diastolic parameters were normal. The average left ventricular global longitudinal strain is -17.9 %. The global longitudinal strain is normal. 2. Right  ventricular systolic function is normal. The right ventricular size is normal. 3. Left atrial size was moderately dilated. 4. Right atrial size was mildly dilated. 5. The mitral valve is abnormal. Trivial mitral valve regurgitation. No evidence of mitral stenosis. 6. Comared to TTE done 11/07/22 mean gradient 28-> 37.5 mmHg peak 49.3-> 66.3 mmHg DVI decreased from 0.33 to 0.28. AVA greater as LVOT diameter used in this study 2.4 and in prior was 2.1 . The aortic valve is tricuspid. There is severe calcifcation of the aortic valve. There is severe thickening of the aortic valve. Aortic valve regurgitation is mild. Moderate to severe aortic valve stenosis. 7. The inferior vena cava is normal in size with greater than 50% respiratory variability, suggesting right atrial pressure of 3 mmHg.  FINDINGS Left Ventricle: Left ventricular ejection fraction, by estimation, is 60 to 65%. The left ventricle has normal function. The left ventricle has no regional wall motion abnormalities. The average left ventricular global longitudinal strain is -17.9 %. Strain was performed and the global longitudinal strain is normal. The left ventricular internal cavity size was normal in size. There is no left ventricular hypertrophy. Left ventricular diastolic parameters were normal.  Right Ventricle: The right ventricular size is normal. No increase in right ventricular wall thickness. Right ventricular systolic function is normal.  Left Atrium: Left atrial size was moderately dilated.  Right Atrium: Right atrial size was mildly dilated.  Pericardium: There is no evidence of pericardial effusion.  Mitral Valve: The mitral valve is abnormal. There is mild thickening of the mitral valve leaflet(s). There is mild calcification of the mitral valve leaflet(s). Mild mitral annular calcification. Trivial mitral valve regurgitation. No evidence of mitral valve stenosis.  Tricuspid Valve: The tricuspid valve is normal in  structure. Tricuspid valve regurgitation is mild . No evidence of tricuspid stenosis.  Aortic Valve: Comared to TTE done 11/07/22 mean gradient 28-> 37.5 mmHg peak 49.3-> 66.3 mmHg DVI decreased from 0.33 to 0.28. AVA greater as LVOT diameter used in this study 2.4 and in prior was 2.1. The aortic valve is tricuspid. There is severe calcifcation of the aortic valve. There is severe thickening of the aortic valve. Aortic valve regurgitation is mild. Aortic regurgitation PHT measures 496 msec. Moderate to severe aortic stenosis is present. Aortic valve mean gradient measures 37.5 mmHg. Aortic valve peak gradient measures 66.3 mmHg. Aortic valve area, by VTI measures 1.26 cm.  Pulmonic Valve: The pulmonic valve was normal in structure. Pulmonic valve regurgitation is not visualized. No evidence of pulmonic stenosis.  Aorta: The aortic root is normal in size and structure.  Venous: The inferior vena cava is normal in size with greater than 50% respiratory variability, suggesting right atrial pressure  of 3 mmHg.  IAS/Shunts: No atrial level shunt detected by color flow Doppler.  Additional Comments: 3D was performed not requiring image post processing on an independent workstation and was normal.   LEFT VENTRICLE PLAX 2D LVIDd:         4.90 cm   Diastology LVIDs:         2.40 cm   LV e' medial:    5.55 cm/s LV PW:         1.10 cm   LV E/e' medial:  15.3 LV IVS:        0.80 cm   LV e' lateral:   7.45 cm/s LVOT diam:     2.40 cm   LV E/e' lateral: 11.4 LV SV:         121 LV SV Index:   62        2D Longitudinal Strain LVOT Area:     4.52 cm  2D Strain GLS (A4C):   -18.7 % LV IVRT:       77 msec   2D Strain GLS (A3C):   -17.8 % 2D Strain GLS (A2C):   -17.3 % 2D Strain GLS Avg:     -17.9 %  3D Volume EF: 3D EF:        75 % LV EDV:       187 ml LV ESV:       47 ml LV SV:        140 ml  RIGHT VENTRICLE             IVC RV Basal diam:  3.90 cm     IVC diam: 1.20 cm RV S prime:     14.00  cm/s TAPSE (M-mode): 2.4 cm      PULMONARY VEINS RVSP:           29.6 mmHg   Diastolic Velocity: 47.10 cm/s S/D Velocity:       1.50 Systolic Velocity:  70.00 cm/s  LEFT ATRIUM             Index        RIGHT ATRIUM           Index LA diam:        4.60 cm 2.35 cm/m   RA Pressure: 3.00 mmHg LA Vol (A2C):   52.9 ml 26.97 ml/m  RA Area:     22.40 cm LA Vol (A4C):   83.7 ml 42.67 ml/m  RA Volume:   70.10 ml  35.74 ml/m LA Biplane Vol: 72.7 ml 37.06 ml/m AORTIC VALVE AV Area (Vmax):    1.21 cm AV Area (Vmean):   1.18 cm AV Area (VTI):     1.26 cm AV Vmax:           407.00 cm/s AV Vmean:          284.750 cm/s AV VTI:            0.963 m AV Peak Grad:      66.3 mmHg AV Mean Grad:      37.5 mmHg LVOT Vmax:         109.25 cm/s LVOT Vmean:        74.225 cm/s LVOT VTI:          0.268 m LVOT/AV VTI ratio: 0.28 AI PHT:            496 msec  AORTA Ao Root diam: 3.30 cm Ao Asc diam:  3.20 cm  MITRAL VALVE  TRICUSPID VALVE MV Area (PHT): cm          TR Peak grad:   26.6 mmHg MV Decel Time: 212 msec     TR Vmax:        258.00 cm/s MV E velocity: 84.75 cm/s   Estimated RAP:  3.00 mmHg MV A velocity: 108.50 cm/s  RVSP:           29.6 mmHg MV E/A ratio:  0.78 SHUNTS Systemic VTI:  0.27 m Systemic Diam: 2.40 cm  Maude Emmer MD Electronically signed by Maude Emmer MD Signature Date/Time: 11/16/2023/3:11:52 PM    Final      CT SCANS  CT CARDIAC SCORING (SELF PAY ONLY) 09/07/2023  Addendum 09/11/2023 11:08 AM ADDENDUM REPORT: 09/11/2023 11:05  EXAM: OVER-READ INTERPRETATION  CT CHEST  The following report is an over-read performed by radiologist Dr. Andrea Gasman of [facility] on 09/11/2023. This over-read does not include interpretation of cardiac or coronary anatomy or pathology. The coronary calcium score interpretation by the cardiologist is attached.  COMPARISON:  None.  FINDINGS: Vascular: Aortic atherosclerosis. The included aorta  is normal in caliber.  Mediastinum/nodes: No adenopathy or mass. Unremarkable esophagus.  Lungs: No focal airspace disease. 4 mm left lower lobe nodule series 4, image 28. No pleural fluid. The included airways are patent.  Upper abdomen: No acute or unexpected findings.  Musculoskeletal: There are no acute or suspicious osseous abnormalities.  IMPRESSION: 1. Aortic Atherosclerosis (ICD10-I70.0). 2. A 4 mm left lower lobe pulmonary nodule. Per Fleischner Society Guidelines, no routine follow-up imaging is recommended. These guidelines do not apply to immunocompromised patients and patients with cancer. Follow up in patients with significant comorbidities as clinically warranted. For lung cancer screening, adhere to Lung-RADS guidelines. Reference: Radiology. 2017; 284(1):228-43.   Electronically Signed By: Andrea Gasman M.D. On: 09/11/2023 11:05  Narrative CLINICAL DATA:  Cardiovascular Disease Risk stratification  EXAM: Coronary Calcium Score  TECHNIQUE: A gated, non-contrast computed tomography scan of the heart was performed using 3mm slice thickness. Axial images were analyzed on a dedicated workstation. Calcium scoring of the coronary arteries was performed using the Agatston method.  FINDINGS: Coronary arteries: Normal origins.  Coronary Calcium Score:  Left main: 37  Left anterior descending artery: 251  Left circumflex artery: 141  Right coronary artery: 205  Total: 634  Percentile: 64th  Pericardium: Normal.  Ascending Aorta: Normal caliber.  Severe aortic valve calcifications (AV calcium score 3021)  Non-cardiac: See separate report from [facility].  IMPRESSION: 1. Coronary calcium score of 634. This was 64th percentile for age-, race-, and sex-matched controls.  2.  Severe aortic valve calcifications (AV calcium score 3021)  RECOMMENDATIONS: Coronary artery calcium (CAC) score is a strong predictor of incident coronary  heart disease (CHD) and provides predictive information beyond traditional risk factors. CAC scoring is reasonable to use in the decision to withhold, postpone, or initiate statin therapy in intermediate-risk or selected borderline-risk asymptomatic adults (age 75-75 years and LDL-C >=70 to <190 mg/dL) who do not have diabetes or established atherosclerotic cardiovascular disease (ASCVD).* In intermediate-risk (10-year ASCVD risk >=7.5% to <20%) adults or selected borderline-risk (10-year ASCVD risk >=5% to <7.5%) adults in whom a CAC score is measured for the purpose of making a treatment decision the following recommendations have been made:  If CAC=0, it is reasonable to withhold statin therapy and reassess in 5 to 10 years, as long as higher risk conditions are absent (diabetes mellitus, family history of premature CHD in  first degree relatives (males <55 years; females <65 years), cigarette smoking, or LDL >=190 mg/dL).  If CAC is 1 to 99, it is reasonable to initiate statin therapy for patients >=59 years of age.  If CAC is >=100 or >=75th percentile, it is reasonable to initiate statin therapy at any age.  Cardiology referral should be considered for patients with CAC scores >=400 or >=75th percentile.  *2018 AHA/ACC/AACVPR/AAPA/ABC/ACPM/ADA/AGS/APhA/ASPC/NLA/PCNA Guideline on the Management of Blood Cholesterol: A Report of the American College of Cardiology/American Heart Association Task Force on Clinical Practice Guidelines. J Am Coll Cardiol. 2019;73(24):3168-3209.  Lonni Nanas, MD  Electronically Signed: By: Lonni Nanas M.D. On: 09/09/2023 12:05     ______________________________________________________________________________________________      EKG:        Recent Labs: No results found for requested labs within last 365 days.  Recent Lipid Panel No results found for: CHOL, TRIG, HDL, CHOLHDL, VLDL, LDLCALC,  LDLDIRECT   Risk Assessment/Calculations:                Physical Exam:    VS:  BP 116/70 (BP Location: Left Arm, Patient Position: Sitting, Cuff Size: Normal)   Pulse 72   Ht 5' 9 (1.753 m)   Wt 178 lb 12.8 oz (81.1 kg)   SpO2 97%   BMI 26.40 kg/m     Wt Readings from Last 3 Encounters:  01/12/24 178 lb 12.8 oz (81.1 kg)  01/08/24 183 lb (83 kg)  11/07/22 177 lb (80.3 kg)     GEN:  Well nourished, well developed in no acute distress HEENT: Normal NECK: No JVD; No carotid bruits LYMPHATICS: No lymphadenopathy CARDIAC: RRR, no murmurs, rubs, gallops RESPIRATORY:  Clear to auscultation without rales, wheezing or rhonchi  ABDOMEN: Soft, non-tender, non-distended MUSCULOSKELETAL:  No edema; No deformity  SKIN: Warm and dry NEUROLOGIC:  Alert and oriented x 3 PSYCHIATRIC:  Normal affect   Assessment & Plan Nonrheumatic aortic valve stenosis The patient has severe, stage D1 aortic stenosis. He reports NYHA functional class 1-II symptoms of exertional dyspnea.   I have reviewed the natural history of aortic stenosis with the patient today. We have discussed the limitations of medical therapy and the poor prognosis associated with symptomatic aortic stenosis. We have reviewed potential treatment options, including palliative medical therapy, conventional surgical aortic valve replacement, and transcatheter aortic valve replacement. We discussed treatment options in the context of the patient's specific comorbid medical conditions.    Further evaluation is necessary to determine if the patient has suitable cardiac and vascular anatomy for TAVR. I have recommended cardiac catheterization to evaluate for the presence of CAD (coronary angiography +/- PCI). I have reviewed the risks, indications, and alternatives to cardiac catheterization, possible angioplasty, and stenting with the patient. Risks include but are not limited to bleeding, infection, vascular injury, stroke,  myocardial infection, arrhythmia, kidney injury, radiation-related injury in the case of prolonged fluoroscopy use, emergency cardiac surgery, and death. The patient understands the risks of serious complication is 1-2 in 1000 with diagnostic cardiac cath and 1-2% or less with angioplasty/stenting.  The patient will then undergo a gated cardiac CTA to evaluate cardiac anatomy and a CTA of the chest/abd/pelvis to evaluate vascular access. Once these studies are completed, the patient's case will be reviewed by our multidisciplinary heart valve team and then he will be referred for formal cardiac surgical consultation.   I demonstrated the TAVR procedural animation and discussed procedural steps, potential complications of TAVR, expected postoperative recovery, and rationale for  treatment with the patient today.  He understands and all of his questions were answered. Pre-procedure lab exam             Medication Adjustments/Labs and Tests Ordered: Current medicines are reviewed at length with the patient today.  Concerns regarding medicines are outlined above.  Orders Placed This Encounter  Procedures   CBC   Basic metabolic panel with GFR   No orders of the defined types were placed in this encounter.   Patient Instructions  Medication Instructions:  The current medical regimen is effective;  continue present plan and medications.  *If you need a refill on your cardiac medications before your next appointment, please call your pharmacy*  Lab Work: You will need lab before your heart cath. (BMP, CBC)  You do not need to be fasting.  If you have labs (blood work) drawn today and your tests are completely normal, you will receive your results only by: MyChart Message (if you have MyChart) OR A paper copy in the mail If you have any lab test that is abnormal or we need to change your treatment, we will call you to review the results.  Testing/Procedures:       Cardiac/Peripheral  Catheterization   You are scheduled for a Cardiac Catheterization on ,                       with Dr. .  1. Please arrive at the [facility] (Main Entrance A) at [facility]: [address] [place], [place] [address] at        (This time is 2 hour(s) before your procedure to ensure your preparation).   Free valet parking service is available. You will check in at ADMITTING. The support person will be asked to wait in the waiting room.  It is OK to have someone drop you off and come back when you are ready to be discharged.        Special note: Every effort is made to have your procedure done on time. Please understand that emergencies sometimes delay scheduled procedures.  2. Diet: Nothing to eat after midnight.  3. Hydration:You need to be well hydrated before your procedure. On the day of your cath, you may drink approved liquids (see below) until 2 hours before the procedure, with 16 oz of water  as your last intake.   List of approved liquids water , clear juice, clear tea, black coffee, fruit juices, non-citric and without pulp, carbonated beverages, Gatorade, Kool -Aid, plain Jello-O and plain ice popsicles.  4. Labs: once heart cath has been scheduled.   You do not need to be fasting.  5. Medication instructions in preparation for your procedure:  On the morning of your procedure, take Aspirin  81 mg and any morning medicines NOT listed above.  You may use sips of water .  6. Plan to go home the same day, you will only stay overnight if medically necessary. 7. You MUST have a responsible adult to drive you home. 8. An adult MUST be with you the first 24 hours after you arrive home. 9. Bring a current list of your medications, and the last time and date medication taken. 10. Bring ID and current insurance cards. 11.Please wear clothes that are easy to get on and off and wear slip-on shoes.  Thank you for allowing us  to care for you!   --  Invasive Cardiovascular  services  Follow-Up: At [facility], you and  your health needs are our priority.  As part of our continuing mission to provide you with exceptional heart care, our providers are all part of one team.  This team includes your primary Cardiologist (physician) and Advanced Practice Providers or APPs (Physician Assistants and Nurse Practitioners) who all work together to provide you with the care you need, when you need it.  Your next appointment:   Follow up to be determined.   We recommend signing up for the patient portal called MyChart.  Sign up information is provided on this After Visit Summary.  MyChart is used to connect with patients for Virtual Visits (Telemedicine).  Patients are able to view lab/test results, encounter notes, upcoming appointments, etc.  Non-urgent messages can be sent to your provider as well.   To learn more about what you can do with MyChart, go to [email or web address].      Signed, [name], MD  01/13/2024 9:36 AM    [facility] "

## 2024-01-12 NOTE — Progress Notes (Unsigned)
 Cardiology Office Note:    Date:  01/12/2024   ID:  Keyshawn, Hellwig 05/09/1945, MRN [id number]  PCP:  Loreli Kins, MD   [facility] Providers Cardiologist:  Darryle ONEIDA Decent, MD     Referring MD: Loreli Kins, MD   Chief Complaint  Patient presents with   Aortic Stenosis    History of Present Illness:    Matthew Simpson is a 78 y.o. male referred by Dr Decent for evaluation of aortic stenosis.   The patient is here alone today. He has been physically active and participates in regular exercise. Comorbid conditions include HTN and hyperlipidemia. He had a coronary calcium score in August 2025 showing a coronary calcium score of 634 but an aortic valve calcium score of > 3000.   He reports having a heart murmur during athletic participation physicals in [facility] but never had any limitations placed on him. He was never told of a heart murmur again until Dr Loreli heard it on auscultation a few years ago. He is extremely active. He walks on the elliptical for 40 minutes, covering 3.5 miles. He feels tired when he's finished with exercise but denies any change over the past 5 years. He feels mild shortness of breath near the end of his exercise and recovers within a few minutes. No chest pain or pressure. No lightheadedness or syncope.   He has a torn left rotator cuff, but no other physical limitations. He sees a dentist 3 times/year with no problems reported. He's married, one son in [place], [place], and one daughter in [place], [place].   Current Medications: Current Meds  Medication Sig   aspirin 81 MG tablet Take 81 mg by mouth daily.   atorvastatin (LIPITOR) 40 MG tablet Take 40 mg by mouth daily.   Blood Pressure Monitoring (OMRON 3 SERIES BP MONITOR) DEVI Use as directed to check blood pressure daily.   lisinopril-hydrochlorothiazide (PRINZIDE,ZESTORETIC) 20-12.5 MG tablet Take 2 tablets by mouth every morning.   Multiple Vitamins-Minerals (MULTIVITAMIN ADULT PO)  Take 1 tablet by mouth daily.    Past Medical History:  Diagnosis Date   Aortic stenosis    Hyperlipemia    Hypertension    Past Surgical History:  Procedure Laterality Date   APPENDECTOMY     HERNIA REPAIR     TONSILLECTOMY      Allergies:   Patient has no known allergies.   ROS:   Please see the history of present illness.    All other systems reviewed and are negative.  EKGs/Labs/Other Studies Reviewed:    The following studies were reviewed today: Cardiac Studies & Procedures   ______________________________________________________________________________________________     ECHOCARDIOGRAM  ECHOCARDIOGRAM COMPLETE 11/16/2023  Narrative ECHOCARDIOGRAM REPORT    Patient Name:   Matthew Simpson Date of Exam: 11/16/2023 Medical Rec #:  [id number]      Height:       69.0 in Accession #:    [id number]     Weight:       177.0 lb Date of Birth:  [birth year]     BSA:          1.961 m Patient Age:    77 years       BP:           120/70 mmHg Patient Gender: M              HR:           63 bpm. Exam Location:  [facility]  [address]  Procedure: 2D Echo, 3D Echo, Cardiac Doppler, Color Doppler and Strain Analysis (Both Spectral and Color Flow Doppler were utilized during procedure).  Indications:    I35.0 Aortic Stenosis  History:        Patient has prior history of Echocardiogram examinations, most recent 11/07/2022. Aortic Valve Disease; Risk Factors:Former Smoker, Hypertension, Dyslipidemia and Family History of Coronary Artery Disease. Aortic Stenosis.  Sonographer:    Heather Hawks RDCS Referring Phys: JOEN GENTRY  IMPRESSIONS   1. Left ventricular ejection fraction, by estimation, is 60 to 65%. The left ventricle has normal function. The left ventricle has no regional wall motion abnormalities. Left ventricular diastolic parameters were normal. The average left ventricular global longitudinal strain is -17.9 %. The global longitudinal strain is normal. 2. Right  ventricular systolic function is normal. The right ventricular size is normal. 3. Left atrial size was moderately dilated. 4. Right atrial size was mildly dilated. 5. The mitral valve is abnormal. Trivial mitral valve regurgitation. No evidence of mitral stenosis. 6. Comared to TTE done 11/07/22 mean gradient 28-> 37.5 mmHg peak 49.3-> 66.3 mmHg DVI decreased from 0.33 to 0.28. AVA greater as LVOT diameter used in this study 2.4 and in prior was 2.1 . The aortic valve is tricuspid. There is severe calcifcation of the aortic valve. There is severe thickening of the aortic valve. Aortic valve regurgitation is mild. Moderate to severe aortic valve stenosis. 7. The inferior vena cava is normal in size with greater than 50% respiratory variability, suggesting right atrial pressure of 3 mmHg.  FINDINGS Left Ventricle: Left ventricular ejection fraction, by estimation, is 60 to 65%. The left ventricle has normal function. The left ventricle has no regional wall motion abnormalities. The average left ventricular global longitudinal strain is -17.9 %. Strain was performed and the global longitudinal strain is normal. The left ventricular internal cavity size was normal in size. There is no left ventricular hypertrophy. Left ventricular diastolic parameters were normal.  Right Ventricle: The right ventricular size is normal. No increase in right ventricular wall thickness. Right ventricular systolic function is normal.  Left Atrium: Left atrial size was moderately dilated.  Right Atrium: Right atrial size was mildly dilated.  Pericardium: There is no evidence of pericardial effusion.  Mitral Valve: The mitral valve is abnormal. There is mild thickening of the mitral valve leaflet(s). There is mild calcification of the mitral valve leaflet(s). Mild mitral annular calcification. Trivial mitral valve regurgitation. No evidence of mitral valve stenosis.  Tricuspid Valve: The tricuspid valve is normal in  structure. Tricuspid valve regurgitation is mild . No evidence of tricuspid stenosis.  Aortic Valve: Comared to TTE done 11/07/22 mean gradient 28-> 37.5 mmHg peak 49.3-> 66.3 mmHg DVI decreased from 0.33 to 0.28. AVA greater as LVOT diameter used in this study 2.4 and in prior was 2.1. The aortic valve is tricuspid. There is severe calcifcation of the aortic valve. There is severe thickening of the aortic valve. Aortic valve regurgitation is mild. Aortic regurgitation PHT measures 496 msec. Moderate to severe aortic stenosis is present. Aortic valve mean gradient measures 37.5 mmHg. Aortic valve peak gradient measures 66.3 mmHg. Aortic valve area, by VTI measures 1.26 cm.  Pulmonic Valve: The pulmonic valve was normal in structure. Pulmonic valve regurgitation is not visualized. No evidence of pulmonic stenosis.  Aorta: The aortic root is normal in size and structure.  Venous: The inferior vena cava is normal in size with greater than 50% respiratory variability, suggesting right atrial pressure of  3 mmHg.  IAS/Shunts: No atrial level shunt detected by color flow Doppler.  Additional Comments: 3D was performed not requiring image post processing on an independent workstation and was normal.   LEFT VENTRICLE PLAX 2D LVIDd:         4.90 cm   Diastology LVIDs:         2.40 cm   LV e' medial:    5.55 cm/s LV PW:         1.10 cm   LV E/e' medial:  15.3 LV IVS:        0.80 cm   LV e' lateral:   7.45 cm/s LVOT diam:     2.40 cm   LV E/e' lateral: 11.4 LV SV:         121 LV SV Index:   62        2D Longitudinal Strain LVOT Area:     4.52 cm  2D Strain GLS (A4C):   -18.7 % LV IVRT:       77 msec   2D Strain GLS (A3C):   -17.8 % 2D Strain GLS (A2C):   -17.3 % 2D Strain GLS Avg:     -17.9 %  3D Volume EF: 3D EF:        75 % LV EDV:       187 ml LV ESV:       47 ml LV SV:        140 ml  RIGHT VENTRICLE             IVC RV Basal diam:  3.90 cm     IVC diam: 1.20 cm RV S prime:     14.00  cm/s TAPSE (M-mode): 2.4 cm      PULMONARY VEINS RVSP:           29.6 mmHg   Diastolic Velocity: 47.10 cm/s S/D Velocity:       1.50 Systolic Velocity:  70.00 cm/s  LEFT ATRIUM             Index        RIGHT ATRIUM           Index LA diam:        4.60 cm 2.35 cm/m   RA Pressure: 3.00 mmHg LA Vol (A2C):   52.9 ml 26.97 ml/m  RA Area:     22.40 cm LA Vol (A4C):   83.7 ml 42.67 ml/m  RA Volume:   70.10 ml  35.74 ml/m LA Biplane Vol: 72.7 ml 37.06 ml/m AORTIC VALVE AV Area (Vmax):    1.21 cm AV Area (Vmean):   1.18 cm AV Area (VTI):     1.26 cm AV Vmax:           407.00 cm/s AV Vmean:          284.750 cm/s AV VTI:            0.963 m AV Peak Grad:      66.3 mmHg AV Mean Grad:      37.5 mmHg LVOT Vmax:         109.25 cm/s LVOT Vmean:        74.225 cm/s LVOT VTI:          0.268 m LVOT/AV VTI ratio: 0.28 AI PHT:            496 msec  AORTA Ao Root diam: 3.30 cm Ao Asc diam:  3.20 cm  MITRAL VALVE  TRICUSPID VALVE MV Area (PHT): cm          TR Peak grad:   26.6 mmHg MV Decel Time: 212 msec     TR Vmax:        258.00 cm/s MV E velocity: 84.75 cm/s   Estimated RAP:  3.00 mmHg MV A velocity: 108.50 cm/s  RVSP:           29.6 mmHg MV E/A ratio:  0.78 SHUNTS Systemic VTI:  0.27 m Systemic Diam: 2.40 cm  Maude Emmer MD Electronically signed by Maude Emmer MD Signature Date/Time: 11/16/2023/3:11:52 PM    Final      CT SCANS  CT CARDIAC SCORING (SELF PAY ONLY) 09/07/2023  Addendum 09/11/2023 11:08 AM ADDENDUM REPORT: 09/11/2023 11:05  EXAM: OVER-READ INTERPRETATION  CT CHEST  The following report is an over-read performed by radiologist Dr. Andrea Gasman of [facility] on 09/11/2023. This over-read does not include interpretation of cardiac or coronary anatomy or pathology. The coronary calcium score interpretation by the cardiologist is attached.  COMPARISON:  None.  FINDINGS: Vascular: Aortic atherosclerosis. The included aorta  is normal in caliber.  Mediastinum/nodes: No adenopathy or mass. Unremarkable esophagus.  Lungs: No focal airspace disease. 4 mm left lower lobe nodule series 4, image 28. No pleural fluid. The included airways are patent.  Upper abdomen: No acute or unexpected findings.  Musculoskeletal: There are no acute or suspicious osseous abnormalities.  IMPRESSION: 1. Aortic Atherosclerosis (ICD10-I70.0). 2. A 4 mm left lower lobe pulmonary nodule. Per Fleischner Society Guidelines, no routine follow-up imaging is recommended. These guidelines do not apply to immunocompromised patients and patients with cancer. Follow up in patients with significant comorbidities as clinically warranted. For lung cancer screening, adhere to Lung-RADS guidelines. Reference: Radiology. 2017; 284(1):228-43.   Electronically Signed By: Andrea Gasman M.D. On: 09/11/2023 11:05  Narrative CLINICAL DATA:  Cardiovascular Disease Risk stratification  EXAM: Coronary Calcium Score  TECHNIQUE: A gated, non-contrast computed tomography scan of the heart was performed using 3mm slice thickness. Axial images were analyzed on a dedicated workstation. Calcium scoring of the coronary arteries was performed using the Agatston method.  FINDINGS: Coronary arteries: Normal origins.  Coronary Calcium Score:  Left main: 37  Left anterior descending artery: 251  Left circumflex artery: 141  Right coronary artery: 205  Total: 634  Percentile: 64th  Pericardium: Normal.  Ascending Aorta: Normal caliber.  Severe aortic valve calcifications (AV calcium score 3021)  Non-cardiac: See separate report from [facility].  IMPRESSION: 1. Coronary calcium score of 634. This was 64th percentile for age-, race-, and sex-matched controls.  2.  Severe aortic valve calcifications (AV calcium score 3021)  RECOMMENDATIONS: Coronary artery calcium (CAC) score is a strong predictor of incident coronary  heart disease (CHD) and provides predictive information beyond traditional risk factors. CAC scoring is reasonable to use in the decision to withhold, postpone, or initiate statin therapy in intermediate-risk or selected borderline-risk asymptomatic adults (age 4-75 years and LDL-C >=70 to <190 mg/dL) who do not have diabetes or established atherosclerotic cardiovascular disease (ASCVD).* In intermediate-risk (10-year ASCVD risk >=7.5% to <20%) adults or selected borderline-risk (10-year ASCVD risk >=5% to <7.5%) adults in whom a CAC score is measured for the purpose of making a treatment decision the following recommendations have been made:  If CAC=0, it is reasonable to withhold statin therapy and reassess in 5 to 10 years, as long as higher risk conditions are absent (diabetes mellitus, family history of premature CHD in  first degree relatives (males <55 years; females <65 years), cigarette smoking, or LDL >=190 mg/dL).  If CAC is 1 to 99, it is reasonable to initiate statin therapy for patients >=41 years of age.  If CAC is >=100 or >=75th percentile, it is reasonable to initiate statin therapy at any age.  Cardiology referral should be considered for patients with CAC scores >=400 or >=75th percentile.  *2018 AHA/ACC/AACVPR/AAPA/ABC/ACPM/ADA/AGS/APhA/ASPC/NLA/PCNA Guideline on the Management of Blood Cholesterol: A Report of the American College of Cardiology/American Heart Association Task Force on Clinical Practice Guidelines. J Am Coll Cardiol. 2019;73(24):3168-3209.  Lonni Nanas, MD  Electronically Signed: By: Lonni Nanas M.D. On: 09/09/2023 12:05     ______________________________________________________________________________________________      EKG:        Recent Labs: No results found for requested labs within last 365 days.  Recent Lipid Panel No results found for: CHOL, TRIG, HDL, CHOLHDL, VLDL, LDLCALC,  LDLDIRECT   Risk Assessment/Calculations:                Physical Exam:    VS:  BP 116/70 (BP Location: Left Arm, Patient Position: Sitting, Cuff Size: Normal)   Pulse 72   Ht 5' 9 (1.753 m)   Wt 178 lb 12.8 oz (81.1 kg)   SpO2 97%   BMI 26.40 kg/m     Wt Readings from Last 3 Encounters:  01/12/24 178 lb 12.8 oz (81.1 kg)  01/08/24 183 lb (83 kg)  11/07/22 177 lb (80.3 kg)     GEN:  Well nourished, well developed in no acute distress HEENT: Normal NECK: No JVD; No carotid bruits LYMPHATICS: No lymphadenopathy CARDIAC: RRR, no murmurs, rubs, gallops RESPIRATORY:  Clear to auscultation without rales, wheezing or rhonchi  ABDOMEN: Soft, non-tender, non-distended MUSCULOSKELETAL:  No edema; No deformity  SKIN: Warm and dry NEUROLOGIC:  Alert and oriented x 3 PSYCHIATRIC:  Normal affect   Assessment & Plan Nonrheumatic aortic valve stenosis The patient has severe, stage D1 aortic stenosis. He reports NYHA functional class 1 symptoms of exertional dyspnea.   I have reviewed the natural history of aortic stenosis with the patient and their family members who are present today. We have discussed the limitations of medical therapy and the poor prognosis associated with symptomatic aortic stenosis. We have reviewed potential treatment options, including palliative medical therapy, conventional surgical aortic valve replacement, and transcatheter aortic valve replacement. We discussed treatment options in the context of the patient's specific comorbid medical conditions.    Further evaluation is necessary to determine if the patient has suitable cardiac and vascular anatomy for TAVR. I have recommended cardiac catheterization to evaluate for the presence of CAD (coronary angiography +/- PCI). I have reviewed the risks, indications, and alternatives to cardiac catheterization, possible angioplasty, and stenting with the patient. Risks include but are not limited to bleeding,  infection, vascular injury, stroke, myocardial infection, arrhythmia, kidney injury, radiation-related injury in the case of prolonged fluoroscopy use, emergency cardiac surgery, and death. The patient understands the risks of serious complication is 1-2 in 1000 with diagnostic cardiac cath and 1-2% or less with angioplasty/stenting.  The patient will then undergo a gated cardiac CTA to evaluate cardiac anatomy and a CTA of the chest/abd/pelvis to evaluate vascular access. Once these studies are completed, the patient's case will be reviewed by our multidisciplinary heart valve team and then he will be referred for formal cardiac surgical consultation.             Medication Adjustments/Labs and  Tests Ordered: Current medicines are reviewed at length with the patient today.  Concerns regarding medicines are outlined above.  No orders of the defined types were placed in this encounter.  No orders of the defined types were placed in this encounter.   There are no Patient Instructions on file for this visit.   Signed, [name], MD  01/12/2024 4:23 PM    [facility]

## 2024-01-12 NOTE — Progress Notes (Unsigned)
 Pre Surgical Assessment: 5 M Walk Test  80M=16.76ft  5 Meter Walk Test- trial 1: 5.46 seconds 5 Meter Walk Test- trial 2: 5.33 seconds 5 Meter Walk Test- trial 3: 4.66 seconds 5 Meter Walk Test Average: 5.15 seconds

## 2024-01-14 ENCOUNTER — Other Ambulatory Visit: Payer: Self-pay | Admitting: *Deleted

## 2024-01-14 DIAGNOSIS — I350 Nonrheumatic aortic (valve) stenosis: Secondary | ICD-10-CM

## 2024-01-14 DIAGNOSIS — Z01812 Encounter for preprocedural laboratory examination: Secondary | ICD-10-CM

## 2024-02-09 ENCOUNTER — Telehealth: Payer: Self-pay | Admitting: *Deleted

## 2024-02-09 LAB — BASIC METABOLIC PANEL WITH GFR
BUN/Creatinine Ratio: 15 (ref 10–24)
BUN: 18 mg/dL (ref 8–27)
CO2: 23 mmol/L (ref 20–29)
Calcium: 10 mg/dL (ref 8.6–10.2)
Chloride: 104 mmol/L (ref 96–106)
Creatinine, Ser: 1.18 mg/dL (ref 0.76–1.27)
Glucose: 107 mg/dL — ABNORMAL HIGH (ref 70–99)
Potassium: 4.4 mmol/L (ref 3.5–5.2)
Sodium: 142 mmol/L (ref 134–144)
eGFR: 63 mL/min/1.73

## 2024-02-09 LAB — CBC
Hematocrit: 44 % (ref 37.5–51.0)
Hemoglobin: 15.3 g/dL (ref 13.0–17.7)
MCH: 32.7 pg (ref 26.6–33.0)
MCHC: 34.8 g/dL (ref 31.5–35.7)
MCV: 94 fL (ref 79–97)
Platelets: 180 x10E3/uL (ref 150–450)
RBC: 4.68 x10E6/uL (ref 4.14–5.80)
RDW: 12.8 % (ref 11.6–15.4)
WBC: 7.2 x10E3/uL (ref 3.4–10.8)

## 2024-02-09 NOTE — Telephone Encounter (Signed)
 Cardiac Catheterization scheduled at [facility] for: Thursday  Arrival time [facility] Main Entrance A at:  Diet: -Nothing to eat after midnight.  Hydration: -May drink clear liquids until 2 hours before the procedure.  Approved liquids: Water, clear tea, black coffee, fruit juices-non-citric and without pulp,Gatorade, plain Jello/popsicles.   -Please drink 16 oz of water 2 hours before procedure.  Medication instructions: -Hold:  Lisinopril-hydrochlorothiazide-AM of procedure  -Other usual morning medications can be taken including aspirin 81 mg.  Plan to go home the same day, you will only stay overnight if medically necessary.  You must have responsible adult to drive you home.  Someone must be with you the first 24 hours after you arrive home.  Reviewed procedure instructions with patient.

## 2024-02-11 ENCOUNTER — Other Ambulatory Visit: Payer: Self-pay

## 2024-02-11 ENCOUNTER — Ambulatory Visit (HOSPITAL_COMMUNITY)
Admission: RE | Admit: 2024-02-11 | Discharge: 2024-02-11 | Disposition: A | Discharge status: Home / Self Care | Attending: Cardiovascular Disease | Admitting: Cardiovascular Disease

## 2024-02-11 ENCOUNTER — Encounter (HOSPITAL_COMMUNITY)
Admission: RE | Disposition: A | Discharge status: Home / Self Care | Payer: Self-pay | Source: Home / Self Care | Attending: Cardiovascular Disease

## 2024-02-11 DIAGNOSIS — E785 Hyperlipidemia, unspecified: Secondary | ICD-10-CM | POA: Insufficient documentation

## 2024-02-11 DIAGNOSIS — I2510 Atherosclerotic heart disease of native coronary artery without angina pectoris: Secondary | ICD-10-CM | POA: Diagnosis not present

## 2024-02-11 DIAGNOSIS — I350 Nonrheumatic aortic (valve) stenosis: Secondary | ICD-10-CM

## 2024-02-11 DIAGNOSIS — I10 Essential (primary) hypertension: Secondary | ICD-10-CM | POA: Diagnosis not present

## 2024-02-11 HISTORY — PX: RIGHT HEART CATH AND CORONARY ANGIOGRAPHY: CATH118264

## 2024-02-11 LAB — POCT I-STAT 7, (LYTES, BLD GAS, ICA,H+H)
Acid-Base Excess: 0 mmol/L (ref 0.0–2.0)
Bicarbonate: 24.3 mmol/L (ref 20.0–28.0)
Calcium, Ion: 1.22 mmol/L (ref 1.15–1.40)
HCT: 36 % — ABNORMAL LOW (ref 39.0–52.0)
Hemoglobin: 12.2 g/dL — ABNORMAL LOW (ref 13.0–17.0)
O2 Saturation: 99 %
Potassium: 3.6 mmol/L (ref 3.5–5.1)
Sodium: 142 mmol/L (ref 135–145)
TCO2: 25 mmol/L (ref 22–32)
pCO2 arterial: 36.9 mmHg (ref 32–48)
pH, Arterial: 7.427 (ref 7.35–7.45)
pO2, Arterial: 119 mmHg — ABNORMAL HIGH (ref 83–108)

## 2024-02-11 LAB — POCT I-STAT EG7
Acid-base deficit: 1 mmol/L (ref 0.0–2.0)
Bicarbonate: 24.3 mmol/L (ref 20.0–28.0)
Calcium, Ion: 1.25 mmol/L (ref 1.15–1.40)
HCT: 37 % — ABNORMAL LOW (ref 39.0–52.0)
Hemoglobin: 12.6 g/dL — ABNORMAL LOW (ref 13.0–17.0)
O2 Saturation: 78 %
Potassium: 3.6 mmol/L (ref 3.5–5.1)
Sodium: 142 mmol/L (ref 135–145)
TCO2: 25 mmol/L (ref 22–32)
pCO2, Ven: 39.7 mmHg — ABNORMAL LOW (ref 44–60)
pH, Ven: 7.394 (ref 7.25–7.43)
pO2, Ven: 43 mmHg (ref 32–45)

## 2024-02-11 MED ORDER — SODIUM CHLORIDE 0.9% FLUSH: 3.0000 mL | INTRAVENOUS | Status: DC | PRN

## 2024-02-11 MED ORDER — SODIUM CHLORIDE 0.9 % IV SOLN: 250.0000 mL | INTRAVENOUS | Status: DC | PRN

## 2024-02-11 MED ORDER — VERAPAMIL HCL 2.5 MG/ML IV SOLN
INTRAVENOUS | Status: DC | PRN
  Administered 2024-02-11: 10 mL via INTRA_ARTERIAL

## 2024-02-11 MED ORDER — ASPIRIN 81 MG PO CHEW: 81.0000 mg | CHEWABLE_TABLET | ORAL | Status: DC

## 2024-02-11 MED ORDER — HEPARIN (PORCINE) IN NACL 2000-0.9 UNIT/L-% IV SOLN
INTRAVENOUS | Status: DC | PRN
  Administered 2024-02-11: 1000 mL

## 2024-02-11 MED ORDER — ACETAMINOPHEN 325 MG PO TABS: 650.0000 mg | ORAL_TABLET | ORAL | Status: DC | PRN

## 2024-02-11 MED ORDER — MIDAZOLAM HCL (PF) 2 MG/2ML IJ SOLN
INTRAMUSCULAR | Status: DC | PRN
  Administered 2024-02-11: 2 mg via INTRAVENOUS

## 2024-02-11 MED ORDER — HYDRALAZINE HCL 20 MG/ML IJ SOLN: 10.0000 mg | INTRAMUSCULAR | Status: DC | PRN

## 2024-02-11 MED ORDER — FREE WATER: 500.0000 mL | Freq: Once | Status: DC

## 2024-02-11 MED ORDER — SODIUM CHLORIDE 0.9% FLUSH: 3.0000 mL | Freq: Two times a day (BID) | INTRAVENOUS | Status: DC

## 2024-02-11 MED ORDER — FENTANYL CITRATE (PF) 100 MCG/2ML IJ SOLN
INTRAMUSCULAR | Status: DC | PRN
  Administered 2024-02-11: 25 ug via INTRAVENOUS

## 2024-02-11 MED ORDER — IOHEXOL 350 MG/ML SOLN
INTRAVENOUS | Status: DC | PRN
  Administered 2024-02-11: 39 mL

## 2024-02-11 MED ORDER — MIDAZOLAM HCL 2 MG/2ML IJ SOLN
INTRAMUSCULAR | Status: AC
  Filled 2024-02-11: qty 2

## 2024-02-11 MED ORDER — HEPARIN SODIUM (PORCINE) 1000 UNIT/ML IJ SOLN
INTRAMUSCULAR | Status: DC | PRN
  Administered 2024-02-11: 4000 [IU] via INTRAVENOUS

## 2024-02-11 MED ORDER — LABETALOL HCL 5 MG/ML IV SOLN: 10.0000 mg | INTRAVENOUS | Status: DC | PRN

## 2024-02-11 MED ORDER — LIDOCAINE HCL (PF) 1 % IJ SOLN
INTRAMUSCULAR | Status: AC
  Filled 2024-02-11: qty 30

## 2024-02-11 MED ORDER — ONDANSETRON HCL 4 MG/2ML IJ SOLN: 4.0000 mg | Freq: Four times a day (QID) | INTRAMUSCULAR | Status: DC | PRN

## 2024-02-11 MED ORDER — HEPARIN SODIUM (PORCINE) 1000 UNIT/ML IJ SOLN
INTRAMUSCULAR | Status: AC
  Filled 2024-02-11: qty 10

## 2024-02-11 MED ORDER — FENTANYL CITRATE (PF) 100 MCG/2ML IJ SOLN
INTRAMUSCULAR | Status: AC
  Filled 2024-02-11: qty 2

## 2024-02-11 MED ORDER — LIDOCAINE HCL (PF) 1 % IJ SOLN
INTRAMUSCULAR | Status: DC | PRN
  Administered 2024-02-11 (×2): 2 mL

## 2024-02-11 MED ORDER — VERAPAMIL HCL 2.5 MG/ML IV SOLN
INTRAVENOUS | Status: AC
  Filled 2024-02-11: qty 2

## 2024-02-11 NOTE — Interval H&P Note (Signed)
 History and Physical Interval Note:  02/11/2024 10:11 AM  Matthew Simpson  has presented today for surgery, with the diagnosis of AS.  The various methods of treatment have been discussed with the patient and family. After consideration of risks, benefits and other options for treatment, the patient has consented to  Procedures: RIGHT/LEFT HEART CATH AND CORONARY ANGIOGRAPHY (N/A) as a surgical intervention.  The patient's history has been reviewed, patient examined, no change in status, stable for surgery.  I have reviewed the patient's chart and labs.  Questions were answered to the patient's satisfaction.     [name]

## 2024-02-11 NOTE — Progress Notes (Signed)
 Patient ready for discharge, wife notified. Right radial without active bleeding, dressing intact.

## 2024-02-11 NOTE — Discharge Instructions (Signed)

## 2024-02-11 NOTE — Progress Notes (Signed)
 TRB removed - no active bleeding noted.

## 2024-02-11 NOTE — Progress Notes (Signed)
 Patient currently tolerating a regular diet with soda, no emesis noted. To the BR upon arrival - positive for void.  Wife has been paged.

## 2024-02-12 ENCOUNTER — Encounter (HOSPITAL_COMMUNITY): Payer: Self-pay | Admitting: Cardiovascular Disease

## 2024-02-26 ENCOUNTER — Ambulatory Visit (HOSPITAL_COMMUNITY): Admission: RE | Admit: 2024-02-26 | Discharge: 2024-02-26 | Disposition: A | Attending: Cardiovascular Disease

## 2024-02-26 DIAGNOSIS — I350 Nonrheumatic aortic (valve) stenosis: Secondary | ICD-10-CM | POA: Diagnosis present

## 2024-02-26 MED ORDER — IOHEXOL 350 MG/ML SOLN
100.0000 mL | Freq: Once | INTRAVENOUS | Status: AC | PRN
  Administered 2024-02-26: 100 mL via INTRAVENOUS

## 2024-02-26 NOTE — Progress Notes (Signed)
 Procedure Type: Isolated AVR Perioperative Outcome Estimate % Operative Mortality 1.66% Morbidity & Mortality 7.74% Stroke 1.36% Renal Failure 1.41% Reoperation 4.06% Prolonged Ventilation 3.36% Deep Sternal Wound Infection 0.042% Long Hospital Stay (>14 days) 2.97% Short Hospital Stay (<6 days)* 49.5%

## 2024-02-28 ENCOUNTER — Ambulatory Visit: Payer: Self-pay | Admitting: Cardiovascular Disease

## 2024-03-01 NOTE — Progress Notes (Unsigned)
 " [facility]   MULTIDISCIPLINARY HEART VALVE CLINIC    [facility] Medical Record #[id number] Date of Birth: Jun 21, 1945  Referring: Loreli Kins, MD Primary Care: Loreli Kins, MD Primary Cardiologist:[facility] ONEIDA Decent, MD  Chief Complaint:   No chief complaint on file.   History of Present Illness:     Matthew [facility] is a 79 y.o. 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  My measurements: Annulus - Area 472, Dia 24.4, peri 78.0 Sinuses - R 29.9, L 30.9, N 30.3 Coronaries - R 15.1, L 13.2 Access - Some thrombus in infrarenal aorta, both bifurcations low, femorals appear adequate   Past Medical History:  Diagnosis Date   Aortic stenosis    Hyperlipemia    Hypertension     Past Surgical History:  Procedure Laterality Date   APPENDECTOMY     HERNIA REPAIR     RIGHT HEART CATH AND CORONARY ANGIOGRAPHY N/A 02/11/2024   Procedure: RIGHT HEART CATH AND CORONARY ANGIOGRAPHY;  Surgeon: Wonda Sharper, MD;  Location: [facility] INVASIVE CV LAB;  Service: Cardiovascular;  Laterality: N/A;   TONSILLECTOMY      Social History:  Tobacco Use History[1]  Social History   Substance and Sexual Activity  Alcohol Use Yes   Alcohol/week: 1.0 standard drink of alcohol   Types: 1 Cans of beer per week     Allergies[2]    Current Outpatient Medications  Medication Sig Dispense Refill   aspirin  81 MG tablet Take 81 mg by mouth daily.     atorvastatin (LIPITOR) 40 MG tablet Take 40 mg by mouth daily.     Blood Pressure Monitoring (OMRON 3 SERIES BP MONITOR) DEVI Use as directed to check blood pressure daily. 1 each 0   lisinopril-hydrochlorothiazide (PRINZIDE,ZESTORETIC) 20-12.5 MG tablet Take 2 tablets by mouth every morning.     Multiple Vitamins-Minerals (MULTIVITAMIN ADULT PO) Take 1 tablet by mouth daily.     No current facility-administered medications for this visit.    (Not in a hospital admission)   Family History   Problem Relation Age of Onset   Heart failure Father      Review of Systems:   ROS    Physical Exam: There were no vitals taken for this visit. Physical Exam    Cardiac Studies & Procedures   ______________________________________________________________________________________________ CARDIAC CATHETERIZATION  CARDIAC CATHETERIZATION 02/11/2024  Conclusion   Mid LM to Dist LM lesion is 30% stenosed.   Prox LAD to Mid LAD lesion is 30% stenosed.  Patent coronary arteries with no obstructive CAD Heavily calcified, restricted aortic valve leaflets on fluoroscopy consistent with the patient's known history of severe aortic stenosis Right heart catheterization data: RA mean 7 mmHg RV 26/12 mmHg PA 28/14 mean 19 mmHg PCWP 12 mmHg CO/CI 6.02/3.04  Plan: continue TAVR evaluation  Findings Coronary Findings Diagnostic  Dominance: Right  Left Main Mid LM to Dist LM lesion is 30% stenosed.  Left Anterior Descending There is mild diffuse disease throughout the vessel. The LAD has mild nonobstructive plaquing with no high-grade coronary stenoses Prox LAD to Mid LAD lesion is 30% stenosed.  Left Circumflex The vessel exhibits minimal luminal irregularities. The mid circumflex trifurcates into three OM branches. There is no obstructive disease present.  Right Coronary Artery Vessel is moderate in size. There is mild diffuse disease throughout the vessel. The RCA has mild diffuse nonobstructive plaquing with no significant stenoses  Intervention  No interventions have been documented.  ECHOCARDIOGRAM  ECHOCARDIOGRAM COMPLETE 11/16/2023  Narrative ECHOCARDIOGRAM REPORT    Patient Name:   Matthew Simpson Date of Exam: 11/16/2023 Medical Rec #:  [id number]      Height:       69.0 in Accession #:    [id number]     Weight:       177.0 lb Date of Birth:  30-Dec-1945     BSA:          1.961 m Patient Age:    77 years       BP:           120/70 mmHg Patient Gender: M               HR:           63 bpm. Exam Location:  [address]  Procedure: 2D Echo, 3D Echo, Cardiac Doppler, Color Doppler and Strain Analysis (Both Spectral and Color Flow Doppler were utilized during procedure).  Indications:    I35.0 Aortic Stenosis  History:        Patient has prior history of Echocardiogram examinations, most recent 11/07/2022. Aortic Valve Disease; Risk Factors:Former Smoker, Hypertension, Dyslipidemia and Family History of Coronary Artery Disease. Aortic Stenosis.  Sonographer:    Heather Hawks RDCS Referring Phys: JOEN GENTRY  IMPRESSIONS   1. Left ventricular ejection fraction, by estimation, is 60 to 65%. The left ventricle has normal function. The left ventricle has no regional wall motion abnormalities. Left ventricular diastolic parameters were normal. The average left ventricular global longitudinal strain is -17.9 %. The global longitudinal strain is normal. 2. Right ventricular systolic function is normal. The right ventricular size is normal. 3. Left atrial size was moderately dilated. 4. Right atrial size was mildly dilated. 5. The mitral valve is abnormal. Trivial mitral valve regurgitation. No evidence of mitral stenosis. 6. Comared to TTE done 11/07/22 mean gradient 28-> 37.5 mmHg peak 49.3-> 66.3 mmHg DVI decreased from 0.33 to 0.28. AVA greater as LVOT diameter used in this study 2.4 and in prior was 2.1 . The aortic valve is tricuspid. There is severe calcifcation of the aortic valve. There is severe thickening of the aortic valve. Aortic valve regurgitation is mild. Moderate to severe aortic valve stenosis. 7. The inferior vena cava is normal in size with greater than 50% respiratory variability, suggesting right atrial pressure of 3 mmHg.  FINDINGS Left Ventricle: Left ventricular ejection fraction, by estimation, is 60 to 65%. The left ventricle has normal function. The left ventricle has no regional wall motion abnormalities. The  average left ventricular global longitudinal strain is -17.9 %. Strain was performed and the global longitudinal strain is normal. The left ventricular internal cavity size was normal in size. There is no left ventricular hypertrophy. Left ventricular diastolic parameters were normal.  Right Ventricle: The right ventricular size is normal. No increase in right ventricular wall thickness. Right ventricular systolic function is normal.  Left Atrium: Left atrial size was moderately dilated.  Right Atrium: Right atrial size was mildly dilated.  Pericardium: There is no evidence of pericardial effusion.  Mitral Valve: The mitral valve is abnormal. There is mild thickening of the mitral valve leaflet(s). There is mild calcification of the mitral valve leaflet(s). Mild mitral annular calcification. Trivial mitral valve regurgitation. No evidence of mitral valve stenosis.  Tricuspid Valve: The tricuspid valve is normal in structure. Tricuspid valve regurgitation is mild . No evidence of tricuspid stenosis.  Aortic Valve: Comared to TTE done 11/07/22 mean gradient 28->  37.5 mmHg peak 49.3-> 66.3 mmHg DVI decreased from 0.33 to 0.28. AVA greater as LVOT diameter used in this study 2.4 and in prior was 2.1. The aortic valve is tricuspid. There is severe calcifcation of the aortic valve. There is severe thickening of the aortic valve. Aortic valve regurgitation is mild. Aortic regurgitation PHT measures 496 msec. Moderate to severe aortic stenosis is present. Aortic valve mean gradient measures 37.5 mmHg. Aortic valve peak gradient measures 66.3 mmHg. Aortic valve area, by VTI measures 1.26 cm.  Pulmonic Valve: The pulmonic valve was normal in structure. Pulmonic valve regurgitation is not visualized. No evidence of pulmonic stenosis.  Aorta: The aortic root is normal in size and structure.  Venous: The inferior vena cava is normal in size with greater than 50% respiratory variability, suggesting right  atrial pressure of 3 mmHg.  IAS/Shunts: No atrial level shunt detected by color flow Doppler.  Additional Comments: 3D was performed not requiring image post processing on an independent workstation and was normal.   LEFT VENTRICLE PLAX 2D LVIDd:         4.90 cm   Diastology LVIDs:         2.40 cm   LV e' medial:    5.55 cm/s LV PW:         1.10 cm   LV E/e' medial:  15.3 LV IVS:        0.80 cm   LV e' lateral:   7.45 cm/s LVOT diam:     2.40 cm   LV E/e' lateral: 11.4 LV SV:         121 LV SV Index:   62        2D Longitudinal Strain LVOT Area:     4.52 cm  2D Strain GLS (A4C):   -18.7 % LV IVRT:       77 msec   2D Strain GLS (A3C):   -17.8 % 2D Strain GLS (A2C):   -17.3 % 2D Strain GLS Avg:     -17.9 %  3D Volume EF: 3D EF:        75 % LV EDV:       187 ml LV ESV:       47 ml LV SV:        140 ml  RIGHT VENTRICLE             IVC RV Basal diam:  3.90 cm     IVC diam: 1.20 cm RV S prime:     14.00 cm/s TAPSE (M-mode): 2.4 cm      PULMONARY VEINS RVSP:           29.6 mmHg   Diastolic Velocity: 47.10 cm/s S/D Velocity:       1.50 Systolic Velocity:  70.00 cm/s  LEFT ATRIUM             Index        RIGHT ATRIUM           Index LA diam:        4.60 cm 2.35 cm/m   RA Pressure: 3.00 mmHg LA Vol (A2C):   52.9 ml 26.97 ml/m  RA Area:     22.40 cm LA Vol (A4C):   83.7 ml 42.67 ml/m  RA Volume:   70.10 ml  35.74 ml/m LA Biplane Vol: 72.7 ml 37.06 ml/m AORTIC VALVE AV Area (Vmax):    1.21 cm AV Area (Vmean):   1.18 cm AV Area (VTI):  1.26 cm AV Vmax:           407.00 cm/s AV Vmean:          284.750 cm/s AV VTI:            0.963 m AV Peak Grad:      66.3 mmHg AV Mean Grad:      37.5 mmHg LVOT Vmax:         109.25 cm/s LVOT Vmean:        74.225 cm/s LVOT VTI:          0.268 m LVOT/AV VTI ratio: 0.28 AI PHT:            496 msec  AORTA Ao Root diam: 3.30 cm Ao Asc diam:  3.20 cm  MITRAL VALVE                TRICUSPID VALVE MV Area (PHT): cm          TR Peak  grad:   26.6 mmHg MV Decel Time: 212 msec     TR Vmax:        258.00 cm/s MV E velocity: 84.75 cm/s   Estimated RAP:  3.00 mmHg MV A velocity: 108.50 cm/s  RVSP:           29.6 mmHg MV E/A ratio:  0.78 SHUNTS Systemic VTI:  0.27 m Systemic Diam: 2.40 cm  Maude Emmer MD Electronically signed by Maude Emmer MD Signature Date/Time: 11/16/2023/3:11:52 PM    Final      CT SCANS  CT CORONARY MORPH W/CTA COR W/SCORE 02/26/2024  Addendum 02/28/2024  9:49 AM ADDENDUM REPORT: 02/28/2024 09:46  EXAM: OVER-READ INTERPRETATION  CT CHEST  The following report is an over-read performed by radiologist Dr. Ree Levy [facility] on 02/28/2024. This over-read does not include interpretation of cardiac or coronary anatomy or pathology. The coronary CTA interpretation by the cardiologist is attached.  COMPARISON:  None.  FINDINGS: Mediastinum/Nodes: No solid / cystic mediastinal masses. The visualized esophagus is nondistended precluding optimal assessment. No thoracic lymphadenopathy by size criteria.  Lungs/Pleura: Imaged tracheo-bronchial tree is patent. There are dependent changes in bilateral lungs. No mass or consolidation. No pleural effusion or pneumothorax. No suspicious lung nodules.  Upper Abdomen: Visualized upper abdominal viscera within normal limits. There is a tiny sliding hiatal hernia.  Musculoskeletal: The visualized soft tissues of the chest wall are grossly unremarkable. No suspicious osseous lesions.  IMPRESSION: No acute or significant extracardiac findings.   Electronically Signed By: Ree Molt M.D. On: 02/28/2024 09:46  Narrative CLINICAL DATA:  Aortic Stenosis  EXAM: Cardiac TAVR CT  TECHNIQUE: The patient was scanned on a GE apex scanner. 1 beat acquisition triggered in the descending thoracic aorta at 110 HU's. A non contrast, gated CT scan was obtained first with axial slices of 2.5 mm through the heart for valve  scoring. A 120 kV retrospective, gated, contrast scan done with gantry rotation speed of 230 msec and collimation 0.63 mm. A delayed scan was obtained to exclude LAA thrombus. The 3D data set was reconstructed in 5% intervals of the R-R cycle. Best systolic phase was motion corrected Images were analyzed on a dedicated workstation using MPR, MIP and VRT modes The patient received 100 cc of contrast  FINDINGS: Aortic Valve: Calcified tri leaflet AV with score 3086  Aorta: No aneurysm, normal arch vessels mild calcific atherosclerosis  Sino-tubular Junction: 25.9 mm  Ascending Thoracic Aorta: 30.8 mm  Aortic Arch: 27.7 mm  Descending  Thoracic Aorta: 24.2 mm  Sinus of Valsalva Measurements:  Non-coronary: 29.8 mm  Height 20.4 mm  Right - coronary: 29 mm  Height 21.5 mm  Left -   coronary: 30 mm  Height 20.4 mm  Coronary Artery Height above Annulus:  Left Main: 13.1 mm above annulus  Right Coronary: 14.7 mm above annulus  Virtual Basal Annulus Measurements:  Maximum / Minimum Diameter: 25.8 mm x 23.4 mm Average diameter 24.8 mm  Perimeter: 78.6 mm  Area: 483 mm2  Coronary Arteries: Sufficient height above annulus for deployment  Optimum Fluoroscopic Angle for Delivery: RAO 7 Caudal 1 degrees  Membranous septal length 9.4 mm  IMPRESSION: 1.  Tri leaflet AV with calcium score 3086  2. Annular area of 483 mm2 suitable for a 26 mm Sapien valve Alternatively a 29 mm Medtronic valve can be considered  3.  Optimum angiographic angle for deployment RAP 7 Caudal 1 degree  4.  Coronary arteries sufficient height above annulus for deployment  5.  Membranous septal length 9.4 mm  Maude Emmer  Electronically Signed: By: Maude Emmer M.D. On: 02/26/2024 12:04   CT SCANS  CT CARDIAC SCORING (SELF PAY ONLY) 09/07/2023  Addendum 09/11/2023 11:08 AM ADDENDUM REPORT: 09/11/2023 11:05  EXAM: OVER-READ INTERPRETATION  CT CHEST  The following report is an over-read  performed by radiologist Dr. Andrea Gasman of [facility] on 09/11/2023. This over-read does not include interpretation of cardiac or coronary anatomy or pathology. The coronary calcium score interpretation by the cardiologist is attached.  COMPARISON:  None.  FINDINGS: Vascular: Aortic atherosclerosis. The included aorta is normal in caliber.  Mediastinum/nodes: No adenopathy or mass. Unremarkable esophagus.  Lungs: No focal airspace disease. 4 mm left lower lobe nodule series 4, image 28. No pleural fluid. The included airways are patent.  Upper abdomen: No acute or unexpected findings.  Musculoskeletal: There are no acute or suspicious osseous abnormalities.  IMPRESSION: 1. Aortic Atherosclerosis (ICD10-I70.0). 2. A 4 mm left lower lobe pulmonary nodule. Per Fleischner Society Guidelines, no routine follow-up imaging is recommended. These guidelines do not apply to immunocompromised patients and patients with cancer. Follow up in patients with significant comorbidities as clinically warranted. For lung cancer screening, adhere to Lung-RADS guidelines. Reference: Radiology. 2017; 284(1):228-43.   Electronically Signed By: Andrea Gasman M.D. On: 09/11/2023 11:05  Narrative CLINICAL DATA:  Cardiovascular Disease Risk stratification  EXAM: Coronary Calcium Score  TECHNIQUE: A gated, non-contrast computed tomography scan of the heart was performed using 3mm slice thickness. Axial images were analyzed on a dedicated workstation. Calcium scoring of the coronary arteries was performed using the Agatston method.  FINDINGS: Coronary arteries: Normal origins.  Coronary Calcium Score:  Left main: 37  Left anterior descending artery: 251  Left circumflex artery: 141  Right coronary artery: 205  Total: 634  Percentile: 64th  Pericardium: Normal.  Ascending Aorta: Normal caliber.  Severe aortic valve calcifications (AV calcium score  3021)  Non-cardiac: See separate report from [facility].  IMPRESSION: 1. Coronary calcium score of 634. This was 64th percentile for age-, race-, and sex-matched controls.  2.  Severe aortic valve calcifications (AV calcium score 3021)  RECOMMENDATIONS: Coronary artery calcium (CAC) score is a strong predictor of incident coronary heart disease (CHD) and provides predictive information beyond traditional risk factors. CAC scoring is reasonable to use in the decision to withhold, postpone, or initiate statin therapy in intermediate-risk or selected borderline-risk asymptomatic adults (age 18-75 years and LDL-C >=70 to <190 mg/dL) who  do not have diabetes or established atherosclerotic cardiovascular disease (ASCVD).* In intermediate-risk (10-year ASCVD risk >=7.5% to <20%) adults or selected borderline-risk (10-year ASCVD risk >=5% to <7.5%) adults in whom a CAC score is measured for the purpose of making a treatment decision the following recommendations have been made:  If CAC=0, it is reasonable to withhold statin therapy and reassess in 5 to 10 years, as long as higher risk conditions are absent (diabetes mellitus, family history of premature CHD in first degree relatives (males <55 years; females <65 years), cigarette smoking, or LDL >=190 mg/dL).  If CAC is 1 to 99, it is reasonable to initiate statin therapy for patients >=43 years of age.  If CAC is >=100 or >=75th percentile, it is reasonable to initiate statin therapy at any age.  Cardiology referral should be considered for patients with CAC scores >=400 or >=75th percentile.  *2018 AHA/ACC/AACVPR/AAPA/ABC/ACPM/ADA/AGS/APhA/ASPC/NLA/PCNA Guideline on the Management of Blood Cholesterol: A Report of the American College of Cardiology/American Heart Association Task Force on Clinical Practice Guidelines. J Am Coll Cardiol. 2019;73(24):3168-3209.  Lonni Nanas, MD  Electronically Signed: By:  Lonni Nanas M.D. On: 09/09/2023 12:05     ______________________________________________________________________________________________      ECG ***    I have independently reviewed the above radiologic studies and discussed with the patient   Recent Lab Findings: Lab Results  Component Value Date   WBC 7.2 02/08/2024   HGB 12.2 (L) 02/11/2024   HCT 36.0 (L) 02/11/2024   PLT 180 02/08/2024   GLUCOSE 107 (H) 02/08/2024   NA 142 02/11/2024   K 3.6 02/11/2024   CL 104 02/08/2024   CREATININE 1.18 02/08/2024   BUN 18 02/08/2024   CO2 23 02/08/2024      Assessment / Plan:   79 y.o. male with severe aortic stenosis.  STS score: ***.  NYHA Class ***.  The risks and benefits of *** TAVR were discussed in detail.  The risks included death, stroke, paravalvular leak, aortic dissection, annulus rupture, device embolization, acute myocardial infarction, arrhythmia, heart block or need for permanent pacemaker.  We also discussed possibility of an emergent sternotomy to address any procedural complications.  Based on our discussion, we collectively decided that an emergent sternotomy would *** be indicated.  The patient is *** agreeable to proceed.  Based on my review of *** LHC, echo, and CTA, I agree with the multidisciplinary plan to proceed with a transfemoral 26 mm S3 UR TAVR.   I  spent {CHL ONC TIME VISIT - DTPQU:[id number]} counseling the patient face to face.   Con RAMAN Saw Mendenhall 03/01/2024 9:29 AM     [1]  Social History Tobacco Use  Smoking Status Former   Current packs/day: 0.00   Types: Cigarettes   Start date: 1976   Quit date: 2006   Years since quitting: 20.0  Smokeless Tobacco Never  [2] No Known Allergies  "

## 2024-03-03 ENCOUNTER — Ambulatory Visit

## 2024-03-03 VITALS — BP 165/49 | HR 65 | Resp 20 | Ht 69.0 in | Wt 179.0 lb

## 2024-03-03 DIAGNOSIS — I350 Nonrheumatic aortic (valve) stenosis: Secondary | ICD-10-CM

## 2024-03-04 ENCOUNTER — Other Ambulatory Visit: Payer: Self-pay

## 2024-03-04 DIAGNOSIS — I350 Nonrheumatic aortic (valve) stenosis: Secondary | ICD-10-CM

## 2024-03-11 ENCOUNTER — Ambulatory Visit (HOSPITAL_COMMUNITY): Admission: RE | Admit: 2024-03-11 | Source: Ambulatory Visit

## 2024-03-11 ENCOUNTER — Other Ambulatory Visit: Payer: Self-pay

## 2024-03-11 ENCOUNTER — Inpatient Hospital Stay (HOSPITAL_COMMUNITY): Admission: RE | Admit: 2024-03-11

## 2024-03-11 DIAGNOSIS — Z01818 Encounter for other preprocedural examination: Secondary | ICD-10-CM

## 2024-03-11 DIAGNOSIS — I350 Nonrheumatic aortic (valve) stenosis: Secondary | ICD-10-CM

## 2024-03-11 LAB — URINALYSIS, ROUTINE W REFLEX MICROSCOPIC
Bilirubin Urine: NEGATIVE
Glucose, UA: NEGATIVE mg/dL
Hgb urine dipstick: NEGATIVE
Ketones, ur: NEGATIVE mg/dL
Leukocytes,Ua: NEGATIVE
Nitrite: NEGATIVE
Protein, ur: NEGATIVE mg/dL
Specific Gravity, Urine: 1.009 (ref 1.005–1.030)
pH: 6 (ref 5.0–8.0)

## 2024-03-11 LAB — COMPREHENSIVE METABOLIC PANEL WITH GFR
ALT: 26 U/L (ref 0–44)
AST: 27 U/L (ref 15–41)
Albumin: 4.5 g/dL (ref 3.5–5.0)
Alkaline Phosphatase: 76 U/L (ref 38–126)
Anion gap: 8 (ref 5–15)
BUN: 17 mg/dL (ref 8–23)
CO2: 29 mmol/L (ref 22–32)
Calcium: 9.5 mg/dL (ref 8.9–10.3)
Chloride: 103 mmol/L (ref 98–111)
Creatinine, Ser: 1.05 mg/dL (ref 0.61–1.24)
GFR, Estimated: >60 mL/min
Glucose, Bld: 94 mg/dL (ref 70–99)
Potassium: 4.2 mmol/L (ref 3.5–5.1)
Sodium: 140 mmol/L (ref 135–145)
Total Bilirubin: 1.7 mg/dL — ABNORMAL HIGH (ref 0.0–1.2)
Total Protein: 6.8 g/dL (ref 6.5–8.1)

## 2024-03-11 LAB — PROTIME-INR
INR: 1.2 (ref 0.8–1.2)
Prothrombin Time: 16.3 s — ABNORMAL HIGH (ref 11.4–15.2)

## 2024-03-11 LAB — CBC
HCT: 43.6 % (ref 39.0–52.0)
Hemoglobin: 15.2 g/dL (ref 13.0–17.0)
MCH: 32.3 pg (ref 26.0–34.0)
MCHC: 34.9 g/dL (ref 30.0–36.0)
MCV: 92.8 fL (ref 80.0–100.0)
Platelets: 152 10*3/uL (ref 150–400)
RBC: 4.7 MIL/uL (ref 4.22–5.81)
RDW: 14.1 % (ref 11.5–15.5)
WBC: 6.5 10*3/uL (ref 4.0–10.5)
nRBC: 0 % (ref 0.0–0.2)

## 2024-03-11 LAB — TYPE AND SCREEN
ABO/RH(D): A POS
Antibody Screen: NEGATIVE

## 2024-03-11 LAB — SURGICAL PCR SCREEN
MRSA, PCR: NEGATIVE
Staphylococcus aureus: NEGATIVE

## 2024-03-11 NOTE — Progress Notes (Addendum)
 All consents signed by patient at PAT lab appointment. Pt was sent home with printed copy of surgical instructions and CHG soap/CHG soap instructions. All instructions reviewed with patient and questions answered.  Patients chart send to anesthesia for review. Pt denies any respiratory illness/infection in the last two months.   Notified Matthew Simpson of PT result.

## 2024-03-15 ENCOUNTER — Encounter (HOSPITAL_COMMUNITY): Payer: Self-pay

## 2024-03-15 ENCOUNTER — Inpatient Hospital Stay (HOSPITAL_COMMUNITY): Admit: 2024-03-15 | Admitting: Cardiovascular Disease

## 2024-03-15 DIAGNOSIS — I350 Nonrheumatic aortic (valve) stenosis: Secondary | ICD-10-CM
